# Patient Record
Sex: Male | Born: 1955 | Race: White | Hispanic: No
Health system: Midwestern US, Community
[De-identification: ages and names within clinical notes are randomized; demographics above are authoritative.]

## PROBLEM LIST (undated history)

## (undated) DIAGNOSIS — E7800 Pure hypercholesterolemia, unspecified: Secondary | ICD-10-CM

## (undated) DIAGNOSIS — I10 Essential (primary) hypertension: Secondary | ICD-10-CM

## (undated) HISTORY — PX: HERNIA REPAIR: SHX51

## (undated) HISTORY — PX: ROTATOR CUFF REPAIR: SHX139

---

## 2001-08-06 ENCOUNTER — Encounter: Payer: Self-pay | Admitting: Emergency Medicine

## 2001-08-06 ENCOUNTER — Inpatient Hospital Stay (HOSPITAL_COMMUNITY): Admission: EM | Admit: 2001-08-06 | Discharge: 2001-08-14 | Payer: Self-pay | Admitting: Emergency Medicine

## 2001-08-10 ENCOUNTER — Encounter: Payer: Self-pay | Admitting: Internal Medicine

## 2001-09-11 ENCOUNTER — Emergency Department (HOSPITAL_COMMUNITY): Admission: EM | Admit: 2001-09-11 | Discharge: 2001-09-11 | Payer: Self-pay | Admitting: Emergency Medicine

## 2001-09-11 ENCOUNTER — Encounter: Payer: Self-pay | Admitting: Emergency Medicine

## 2001-09-23 ENCOUNTER — Encounter: Payer: Self-pay | Admitting: Emergency Medicine

## 2001-09-23 ENCOUNTER — Emergency Department (HOSPITAL_COMMUNITY): Admission: EM | Admit: 2001-09-23 | Discharge: 2001-09-23 | Payer: Self-pay | Admitting: Emergency Medicine

## 2001-09-25 ENCOUNTER — Emergency Department (HOSPITAL_COMMUNITY): Admission: EM | Admit: 2001-09-25 | Discharge: 2001-09-25 | Payer: Self-pay | Admitting: Emergency Medicine

## 2001-09-25 ENCOUNTER — Encounter: Payer: Self-pay | Admitting: Emergency Medicine

## 2001-10-04 ENCOUNTER — Encounter: Payer: Self-pay | Admitting: Emergency Medicine

## 2001-10-04 ENCOUNTER — Emergency Department (HOSPITAL_COMMUNITY): Admission: EM | Admit: 2001-10-04 | Discharge: 2001-10-04 | Payer: Self-pay | Admitting: Emergency Medicine

## 2001-12-26 ENCOUNTER — Inpatient Hospital Stay (HOSPITAL_COMMUNITY): Admission: RE | Admit: 2001-12-26 | Discharge: 2001-12-30 | Payer: Self-pay | Admitting: General Surgery

## 2002-01-27 ENCOUNTER — Emergency Department (HOSPITAL_COMMUNITY): Admission: EM | Admit: 2002-01-27 | Discharge: 2002-01-27 | Payer: Self-pay

## 2002-02-07 ENCOUNTER — Emergency Department (HOSPITAL_COMMUNITY): Admission: EM | Admit: 2002-02-07 | Discharge: 2002-02-07 | Payer: Self-pay | Admitting: *Deleted

## 2002-03-02 ENCOUNTER — Emergency Department (HOSPITAL_COMMUNITY): Admission: EM | Admit: 2002-03-02 | Discharge: 2002-03-03 | Payer: Self-pay | Admitting: Emergency Medicine

## 2009-01-01 ENCOUNTER — Emergency Department (HOSPITAL_BASED_OUTPATIENT_CLINIC_OR_DEPARTMENT_OTHER): Admission: EM | Admit: 2009-01-01 | Discharge: 2009-01-01 | Payer: Self-pay | Admitting: Emergency Medicine

## 2009-09-28 ENCOUNTER — Encounter: Admission: RE | Admit: 2009-09-28 | Discharge: 2009-09-28 | Payer: Self-pay | Admitting: Specialist

## 2009-09-29 ENCOUNTER — Encounter: Admission: RE | Admit: 2009-09-29 | Discharge: 2009-09-29 | Payer: Self-pay | Admitting: Specialist

## 2009-09-29 ENCOUNTER — Encounter: Payer: Self-pay | Admitting: Internal Medicine

## 2009-10-28 ENCOUNTER — Encounter: Admission: RE | Admit: 2009-10-28 | Discharge: 2009-10-28 | Payer: Self-pay | Admitting: Specialist

## 2009-11-19 ENCOUNTER — Ambulatory Visit: Payer: Self-pay | Admitting: Internal Medicine

## 2009-11-19 DIAGNOSIS — R059 Cough, unspecified: Secondary | ICD-10-CM | POA: Insufficient documentation

## 2009-11-19 DIAGNOSIS — R05 Cough: Secondary | ICD-10-CM

## 2009-11-19 DIAGNOSIS — R930 Abnormal findings on diagnostic imaging of skull and head, not elsewhere classified: Secondary | ICD-10-CM | POA: Insufficient documentation

## 2009-11-19 DIAGNOSIS — I10 Essential (primary) hypertension: Secondary | ICD-10-CM | POA: Insufficient documentation

## 2010-03-18 ENCOUNTER — Emergency Department (HOSPITAL_BASED_OUTPATIENT_CLINIC_OR_DEPARTMENT_OTHER): Admission: EM | Admit: 2010-03-18 | Discharge: 2010-03-18 | Payer: Self-pay | Admitting: Emergency Medicine

## 2010-10-10 ENCOUNTER — Emergency Department: Payer: Self-pay | Admitting: Emergency Medicine

## 2010-11-21 ENCOUNTER — Encounter: Payer: Self-pay | Admitting: Specialist

## 2010-11-30 NOTE — Assessment & Plan Note (Signed)
Summary: Pulmonary new pt eval for cough/ abn ct   Visit Type:  Initial Consult Copy to:  Dr. Lerry Liner Primary Provider/Referring Provider:  Dr. Lerry Liner  CC:  Cough and Dyspnea.  History of Present Illness: 29 yowm with seasonal rhinitis with no cough or prev asthma/ wheezing/ cough.  November 19, 2009 cc August 2010 abd pain dx diverticulitis requing drains but no major surgery and onset of cough worse lying down productive initermittently of yellow sputum. takes breath away never gags vomits, breathing labored sometimes even  if not coughing.    Pt denies any significant sore throat, dysphagia, itching, sneezing,  nasal congestion or excess secretions,  fever, chills, sweats, unintended wt loss, pleuritic or exertional cp, hempoptysis, change in activity tolerance  orthopnea pnd or leg swelling Pt also denies any obvious fluctuation in symptoms with weather or environmental change or other alleviating or aggravating factors.       Current Medications (verified): 1)  Azor 10-20 Mg Tabs (Amlodipine-Olmesartan) .Marland Kitchen.. 1 Once Daily 2)  Klonopin 0.5 Mg Tabs (Clonazepam) .Marland Kitchen.. 1 At Bedtime 3)  Percocet 5-325 Mg Tabs (Oxycodone-Acetaminophen) .Marland Kitchen.. 1 Every 6-8 Hours As Needed  Allergies (verified): No Known Drug Allergies  Past History:  Past Medical History: Hypertension Chronic cough .......................................Marland KitchenWert    - onset 05/2009    - CT chest 09/29/09  1. The opacity questioned by chest x-ray in the region of the lingula probably represents linear atelectasis or scarring. 2. However there is a "tree in bud" appearance within the left lower lobe and to a lesser degree within the right lower lobe suggestive of an active inflammatory process as noted above.  Family History: Negative for respiratory diseases or atopy   Social History: Divorced Lives with Girlfriend Production designer, theatre/television/film Never smoker No ETOH  Review of Systems       The patient complains of  shortness of breath with activity, shortness of breath at rest, and productive cough.  The patient denies non-productive cough, coughing up blood, chest pain, irregular heartbeats, acid heartburn, indigestion, loss of appetite, weight change, abdominal pain, difficulty swallowing, sore throat, tooth/dental problems, headaches, nasal congestion/difficulty breathing through nose, sneezing, itching, ear ache, anxiety, depression, hand/feet swelling, joint stiffness or pain, rash, change in color of mucus, and fever.    Vital Signs:  Patient profile:   55 year old male Height:      73 inches Weight:      243.13 pounds BMI:     32.19 O2 Sat:      95 % on Room air Temp:     98.4 degrees F oral Pulse rate:   56 / minute BP sitting:   136 / 90  (left arm)  Vitals Entered By: Vernie Murders (November 19, 2009 10:03 AM)  O2 Flow:  Room air  Physical Exam  Additional Exam:  pleasant amb wm nad with intermittent violent upper airway pattern cough wt 243 November 19, 2009 HEENT: nl dentition, turbinates, and orophanx. Nl external ear canals without cough reflex NECK :  without JVD/Nodes/TM/ nl carotid upstrokes bilaterally LUNGS: no acc muscle use,   cough reproducible  on  exp maneuvers CV:  RRR  no s3 or murmur or increase in P2, no edema  ABD:  soft and nontender with nl excursion in the supine position. No bruits or organomegaly, bowel sounds nl MS:  warm without deformities, calf tenderness, cyanosis or clubbing SKIN: warm and dry without lesions   NEURO:  alert, approp, no deficits  CT of Chest  Procedure date:  09/29/2009  Findings:      1. The opacity questioned by chest x-ray in the region of the lingula probably represents linear atelectasis or scarring. 2. However there is a 'tree in bud' appearance within the left lower lobe and to a lesser degree within the right lower lobe suggestive of an active inflammatory process as noted above.    Impression &  Recommendations:  Problem # 1:  COUGH (ICD-786.2)  The most common causes of chronic cough in immunocompetent adults include: upper airway cough syndrome (UACS), previously referred to as postnasal drip syndrome,  caused by variety of rhinosinus conditions; (2) asthma; (3) GERD; (4) chronic bronchitis from cigarette smoking or other inhaled environmental irritants; (5) nonasthmatic eosinophilic bronchitis; and (6) bronchiectasis. These conditions, singly or in combination, have accounted for up to 94% of the causes of chronic cough in prospective studies.   Believe this fits best with upper airway cough syndrome so named because it's frequently impossible to sort out how much is LPR vs CR/sinusitis with freq throat clearing generating secondary extra esophageal GERD from wide swings in gastric pressure that occur with throat clearing, promoting self use of mint and menthol lozenges that reduce the lower esophageal sphincter tone and exacerbate the problem further.  These symptoms are easily confused with asthma/copd by even experienced pulmonogists because they overlap so much. These are the same pts who not infrequently have failed to tolerate ace inhibitors,  dry powder inhalers or biphosphonates or report having reflux symptoms that don't respond to standard doses of PPI   for now max rx of GERD with next step sinus ct  Orders: Consultation Level V (16109)  Problem # 2:  ABNORMAL LUNG XRAY (ICD-793.1)  The density in question on cxr is nothing more than linear atx likely related to all the subdiagphragmatic problems he had dating back to Aug 2010.  The "tree in bud" finding is subtle and totally nonspecific and would not pursue this abn with serial ct's but simply follow his symptoms closely and pursue the standard w/u for chronic cough as outlined.  I did advise pt that the standardized cough guidelines recently published in Chest are a 14 step process, not a single office visit,  and are  intended  to address this problem logically,  with an alogrithm dependent on response to each progressive step  to determine a specific diagnosis with  minimal addtional testing needed. Therefore if compliance is an issue this empiric standardized approach simply won't work.   Orders: Consultation Level V [phone])  Medications Added to Medication List This Visit: 1)  Azor 10-20 Mg Tabs (Amlodipine-olmesartan) .Marland Kitchen.. 1 once daily 2)  Klonopin 0.5 Mg Tabs (Clonazepam) .Marland Kitchen.. 1 at bedtime 3)  Percocet 5-325 Mg Tabs (Oxycodone-acetaminophen) .Marland Kitchen.. 1 every 6-8 hours as needed 4)  Nexium 40 Mg Cpdr (Esomeprazole magnesium) .... By mouth daily. take one half hour before eating. 5)  Prednisone 10 Mg Tabs (Prednisone) .... 4 each am x 2days, 2x2days, 1x2days and stop 6)  Tramadol Hcl 50 Mg Tabs (Tramadol hcl) .... One to two by mouth every 4-6 hours as needed for cough 7)  Pepcid Ac Maximum Strength 20 Mg Tabs (Famotidine) .... One at  bedtime  Patient Instructions: 1)  Acid reflux is the leading suspect here and needs to be eliminated  completely before considering additional studies or treatment options. To suppress this maximally, take nexium 30 min before meal of the day  and pepcid 20 mg (  otc) at bedtime plus diet measures as listed 2)  GERD (REFLUX)  is a common cause of respiratory symptoms. It commonly presents without heartburn and can be treated with medication, but also with lifestyle changes including avoidance of late meals, excessive alcohol, smoking cessation, and avoid fatty foods, chocolate, peppermint, colas, red wine, and acidic juices such as orange juice. NO MINT OR MENTHOL PRODUCTS SO NO COUGH DROPS  3)  USE SUGARLESS CANDY INSTEAD (jolley ranchers)  4)  NO OIL BASED VITAMINS  5)  Take mucinex dm  every 12 hours and add tramadol 50 mg up to every 4 hours to suppress the urge to cough. Swallowing water or using ice chips/non mint and menthol containing candies (such as lifesavers or  sugarless jolly ranchers) are also effective.  6)  Prednsione 4 each am x 2days, 2x2days, 1x2days and stop  7)  Please schedule a follow-up appointment in 2 weeks. Prescriptions: TRAMADOL HCL 50 MG  TABS (TRAMADOL HCL) One to two by mouth every 4-6 hours as needed for cough  #40 x 0   Entered and Authorized by:   Nyoka Cowden MD   Signed by:   Nyoka Cowden MD on 11/19/2009   Method used:   Print then Give to Patient   RxID:   [id number] PREDNISONE 10 MG  TABS (PREDNISONE) 4 each am x 2days, 2x2days, 1x2days and stop  #14 x 0   Entered and Authorized by:   Nyoka Cowden MD   Signed by:   Nyoka Cowden MD on 11/19/2009   Method used:   Print then Give to Patient   RxID:   [id number] NEXIUM 40 MG  CPDR (ESOMEPRAZOLE MAGNESIUM) By mouth daily. Take one half hour before eating.  #34 x 2   Entered and Authorized by:   Nyoka Cowden MD   Signed by:   Nyoka Cowden MD on 11/19/2009   Method used:   Print then Give to Patient   RxID:   [id number]

## 2011-03-18 NOTE — Op Note (Signed)
[place]. [facility]  Patient:    Cole Carey, Cole Carey Visit Number: [id number] MRN: [id number]          Service Type: MED Location: MICU 2112 01 Attending Physician:  Madaline Guthrie Dictated by:   Adolph Pollack, M.D. Proc. Date: 08/06/01 Admit Date:  08/05/2001   CC:         Anselmo Rod, M.D.   Operative Report  PREOPERATIVE DIAGNOSIS:  Bleeding duodenal ulcer.  POSTOPERATIVE DIAGNOSIS:  Bleeding duodenal ulcer.  OPERATION PERFORMED:  Exploratory laparotomy, oversewing of bleeding duodenal ulcer, pyloroplasty.  SURGEON:  Adolph Pollack, M.D.  ASSISTANT:  Currie Paris, M.D.  ANESTHESIA:  General.  INDICATIONS FOR PROCEDURE:  The patient is a 55 year old male who came in with lower gastrointestinal bleeding and was found to have a bleeding duodenal ulcer by endoscopy.  Epinephrine injections were attempted to control the bleeding.  However, he continued to have bleeding with hypotension and endoscopy.  He is now brought to the operating room.  OPERATIVE FINDINGS:  In the first portion of the duodenum on the anterior wall there is an ulceration with a visible vessel that was oozing blood.  DESCRIPTION OF PROCEDURE:  He was placed supine on the operating table and general anesthetic was administered.  The abdomen was sterilely prepped and draped.  An upper midline incision was made incising the skin sharply and dividing the subcutaneous tissue and peritoneum with the cautery.  The abdominal cavity was then entered.  A Bookwalter retractor was used for exposure.  He was then placed in the reversed Trendelenburg position.  A Kocher maneuver was performed bringing up the duodenum into the wound.  I then placed stay sutures at the superior and inferior aspects of the pylorus. I made an incision through the pylorus approximately 1 to 2 cm up on the stomach and 1 to 2 cm down onto the duodenum.  Upon making this incision I looked at  the superior and anterior wall and noticed oozing from ulceration with a visible vessel.  I oversewed this with a single 2-0 silk horizontal mattress type suture and this quickly halted the bleeding.  I examined the rest of the area and noted the ampulla and there was some mild drainage from it.  I then noticed a small posterior type ulceration on which I put a single suture in.  I repositioned the NG tube to be in the body of the stomach.  I then performed pyloroplasty in a Heineke-Mikulicz type fashion.  First three sutures were Gambee type sutures and the next sutures were simple interrupted using a 3-0 and 2-0 silk.  I then placed a patch of omentum over the closure.  The abdominal cavity was then irrigated copiously with normal saline and fluid evacuated.  The fascia was then closed with running #1 PDS suture.  The subcutaneous tissues were irrigated and the skin closed with staples.  Before closure, All sponge, needle and instrument counts were reported to be correct.  The patient tolerated the procedure well without any apparent complications and remained hemodynamically stable in the operating room.  He will be taken to the recovery room and the intensive care unit. Dictated by:   Adolph Pollack, M.D. Attending Physician:  Madaline Guthrie DD:  08/06/01 TD:  08/07/01 Job: [id number]

## 2011-03-18 NOTE — Discharge Summary (Signed)
[name]. [facility]  Patient:    Cole Carey, Cole Carey Visit Number: [id number] MRN: [id number]          Service Type: MED Location: [id number] Attending Physician:  Madaline Guthrie Dictated by:   Arlis Porta, M.D. Admit Date:  08/05/2001 Discharge Date: 08/14/2001   CC:         Adolph Pollack, M.D.  Anselmo Rod, M.D.   Discharge Summary  DISCHARGE DIAGNOSES:  Melena secondary to duodenal ulcer.  CONSULTS: 1. Anselmo Rod, M.D., gastroenterology. 2. Adolph Pollack, M.D., surgery.  PROCEDURES: 1. The patient had a chest x-ray done on August 10, 2001, which showed right    jugular central line to the superior vena cava without pneumothorax. 2. The patient had an esophagogastroduodenoscopy with injection of a duodenal    vessel done by Dr. Loreta Ave on August 06, 2001. 3. The patient then went to the operating room for an exploratory laparotomy    with oversewing of bleeding duodenal ulcer and pyloroplasty by    Dr. Abbey Chatters on August 06, 2001. 4. The patient received four units of packed red blood cells transfusion    during his admission. 5. The patient had a Helicobacter pylori antibody test done on August 06, 2001, which was low at 0.14. 6. The patients final CBC on August 13, 2001, showed a white blood cell    count of 6.4, hemoglobin of 10.5, hematocrit of 30.3 and a platelet    count of 291. 7. The patients final BMP done on August 12, 2001, showed a sodium of 139,    potassium of 4.2, chloride of 107, CO2 of 26, BUN of 16, creatinine of    1.0, glucose of 97 and a calcium level of 8.7. 8. The patient had an EKG done on August 06, 2001, which showed normal sinus    rhythm with a heart rate of 77.  HOSPITAL COURSE:  Upper gastrointestinal bleeding secondary to duodenal bleeding ulcer, likely secondary to aspirin use.  This is a 55 year old white male who presented with one days worth of dark, watery diarrhea with a total of  three episodes on the day of admission.  His initial hemoglobin on admission was 13.8 and hematocrit was 40.3.  During his early admission, the patient continued to have bleeding episodes that consisted of dark stools for a total of seven episodes.  The patient had a repeat CBC done the following day, which showed a level of hemoglobin of 10.6 and hematocrit of 30.9.  The patient also started to experience orthostasis symptoms with dizziness and lightheadedness in an upright position.  Given the patients acute deterioration and continued bleeding, the patient was quickly seen by Dr. Loreta Ave from gastroenterology.  He went for emergent EGD.  Dr. Loreta Ave thus saw a bleeding ulcer in the duodenal bulb and injected 9 cc of epinephrine.  The patients blood pressure dropped with a reflex tachycardia at the end of his procedure.  The patient then voluntarily pulled out the endoscope before good coagulation was documented.  Given the patients drop in blood pressure and tachycardia, he was sent to the OR for emergent exploratory laparotomy, which was performed by Dr. Abbey Chatters.  During the procedure, the patient had a sewing of the duodenal bulb and pyloroplasty.  Following the procedure, the patient had serial CBCs checked daily, which showed a stable level of his hematocrit around the 10.0 range.  The patients vital signs seemed to stabilize following his  procedure and had no further bleeding.  His only complication was a slow to resolve ileus that took several days to resolve. Finally, the patient started to pass gas and return of his bowel sounds.  He was started on a clear-liquid diet, which he tolerated and then was advanced to a full regular diet which he tolerated the day of discharge.  At the time of discharge, the patient remained afebrile with continued stable vital signs and was ambulating and was controlled for his pain by oral pain medications. It was decided that it was safe to discharge  the patient to home with followup with Dr. Abbey Chatters in the office.  DISCHARGE MEDICATIONS: 1. Vicodin 5/500 mg take 1-2 tablets p.o. q.4-6h. p.r.n. for pain. 2. Protonix 40 mg p.o. q.d. 3. The patient was told to stop taking aspirin, Advil, Aleve, etc. for pain.    These can cause stomach ulcers.  The patient was advised to take only    Tylenol in the future for pain.  ACTIVITY:  The patient is to avoid heavy lifting over 20 pounds for three weeks.  DIET RESTRICTIONS:  None.  WOUND CARE:  The patient is to watch his incision for signs of infection including fever, increased pain, pus, drainage, redness, warmth and induration of the skin.  The patient was told that he could take showers.  SPECIAL INSTRUCTIONS:  He is to call the doctor or come back to the hospital for signs of infection as above.  Return if blood in his stools or vomiting, chest pain, belly pain, or shortness of breath.  FOLLOWUP:  He has been made an appointment to see Dr. Abbey Chatters, his surgeon on October 25 at 12:10 p.m.  He is to call [phone] if he needs to reschedule. He was also told to call Dr. Trilby Drummer office at [phone] to discuss the need for a colonoscopy.  He was also referred to the [facility] to see Dr. Emmit Alexanders at [phone] if needed in the future for other medical problems. Dictated by:   Arlis Porta, M.D. Attending Physician:  Madaline Guthrie DD:  08/14/01 TD:  08/14/01 Job: [id number]

## 2011-03-18 NOTE — H&P (Signed)
[facility]  Patient:    Cole Carey, Cole Carey Visit Number: [id number] MRN: [id number]          Service Type: MED Location: 3W [phone] Attending Physician:  Arlis Porta Dictated by:   Adolph Pollack, M.D. Admit Date:  12/25/2001                           History and Physical  REASON FOR ADMISSION:  Elective ventral hernia repair.  HISTORY OF PRESENT ILLNESS:  The patient is a 55 year old male who had an emergency exploratory laparotomy and oversewing of bleeding duodenal ulcer back in the fall of 2002.  He was doing well.  He is in [place] and was doing some heavy work and felt a tear in the incision and noticed a bulge.  I examined him in the office and indeed noted that he had a ventral incision hernia of the epigastric wound.  He is now admitted electively for repair.  PAST MEDICAL HISTORY:  Bleeding duodenal ulcer.  PREVIOUS OPERATIONS:  Exploratory laparotomy and oversewing of bleeding duodenaL ulcer; repair of dislocated shoulder; bilateral inguinal hernia repair; umbilical hernia repair.  ALLERGIES:  PENICILLIN.  MEDICATIONS:  Prevacid.  SOCIAL HISTORY:  No alcohol or tobacco use.  He works for [employer].  PHYSICAL EXAMINATION:  GENERAL:  A muscular male in no acute distress.  Pleasant and cooperative.  HEENT:  Extraocular movements intact.  No icterus.  CARDIOVASCULAR:  Heart demonstrates regular rate and rhythm without a murmur. No lower extremity edema.  RESPIRATORY:  Breath sounds are equal and clear.  ABDOMEN:  Soft with upper midline incision and palpable abdominal defect measuring approximately 8 cm.  It is reducible.  Active bowel sounds noted.  EXTREMITIES:  Muscular and full range of motion.  IMPRESSION:  Ventral incisional hernia.  PLAN:  Laparoscopic repair of ventral incisional hernia with mesh.  I did explain the procedure and the risks to him including but not limited to bleeding,  infection, damage to intra-abdominal organs and hernia recurrence. We also talked about strict work restrictions postoperative.  He understands and agrees to proceed. Dictated by:   Adolph Pollack, M.D. Attending Physician:  Arlis Porta DD:  12/28/01 TD:  12/28/01 Job: [id number]

## 2011-03-18 NOTE — Consult Note (Signed)
[place]. [facility]  Patient:    Cole Carey, Cole Carey Visit Number: [id number] MRN: [id number]          Service Type: MED Location: MICU 2112 01 Attending Physician:  Madaline Guthrie Dictated by:   Clance Boll, M.D. Proc. Date: 08/06/01 Admit Date:  08/05/2001   CC:         Adolph Pollack, M.D.   Consultation Report  DATE OF BIRTH:  Dec 21, 2055  PRIMARY MEDICAL DOCTOR:  Gentry Fitz.  REQUESTING CONSULT:  Medicine teaching service.  REASON FOR CONSULTATION:  Melanic stools.  HISTORY OF PRESENT ILLNESS:  This is a very pleasant, 55 year old, white male admitted to the medical teaching service earlier this morning with a complaint of new onset dark black stools every 15 minutes x 3 starting at approximately 7 p.m. last night.  No abdominal pain, nausea, vomiting, fevers, or chills. No history of GI bleed.  Since admission, he notes three more melanic stools with some bright red blood per rectum ("moorishness") and now with dizziness on standing and severe headaches with bowel movements.  The patient admits to daily ASA use (two p.o. q.h.s.), but no NSAIDs.  He is an avid weight lifter and uses large amounts of supplements and Depo-Testosterone.  No recent increased stressors.  No weight loss.  No rash.  No joint pains.  No hematuria.  PAST MEDICAL HISTORY:  Umbilical hernia repair four years ago.  Bilateral inguinal hernia repair as a child.  Right shoulder dislocation.  MEDICATIONS:  1. ASA two p.o. q.h.s.  2. Creatinine.  3. Depo-Testosterone 300 mg IM every week.  4. AD40.  5. Glucose recovery drinks.  6. Mega vitamin every week.  7. K-Lyte on occasion.  8. Whey protein 500 g per day.  9. Androstene dion q.d. 10. History of Dianabol use.  The last time was five to six weeks ago.  ALLERGIES:  PENICILLIN.  FAMILY HISTORY:  Hid dad died of lung cancer at 9, but also had peptic ulcer disease and alcohol and tobacco abuse.  His mom is healthy  at 56.  A sister has lupus and stroke.  No GI cancers.  No bleeding disorders.  SOCIAL HISTORY:  He reports a history of tobacco use, one pack per day x 14 years and quit in 1987.  No ETOH or illicit drug use.  He is married and has two children from his prior marriage.  He is a Administrator and works out q.d., except Sundays.  PHYSICAL EXAMINATION:  He is afebrile with pulse of 104 and a respiratory rate of 16.  The blood pressure laying down is 178/90 with a pulse of 90 and standing is 144/102 with a pulse of 130.  GENERAL APPEARANCE:  He is alert and interactive, but short of breath with exertion.  HEENT:  Sclerae anicteric.  Mucous membranes moist without lesions.  NECK:  Supple.  No LAD.  No JVD.  CARDIOVASCULAR:  Tachycardic S1 and S2.  No murmurs, rubs, or gallops.  LUNGS:  CTAP.  ABDOMEN:  Soft, nontender, and nondistended.  Normoactive bowel sounds.  No HSM.  No masses.  EXTREMITIES:  2+ DP and PT pulses bilaterally.  No edema.  RECTAL:  The patient declined rectal because he had to have a bowel movement, which was approximately 4 ounces in size.  A black, thick stool with a small amount of bright red blood.  LABORATORY DATA:  The hemoglobin on admission was 16 with a hematocrit of 47 and white blood cell count 9.8.  Following admission, the hemoglobin subsequently dropped from 16 to 13.8 to 12.4 to 10.3.  UA normal.  UDS negative.  Sodium 137, potassium 4.9, chloride 109, bicarbonate 23, BUN 28, creatinine 1.1, glucose 112, calcium 8.3, total bilirubin 1.1, alkaline phosphatase 48, SGOT 56, SGPT 84, total protein 6.4, albumin 3.0, calcium 8.1. PTT 22, PT 12.6, INR 1.1.  The EKG showed normal sinus rhythm with a right axis deviation and no acute ST-T wave changes.  ASSESSMENT AND PLAN:  This is a 55 year old white male with new melanic stools likely secondary to an upper GI bleed.  In light of his positive orthostatics and his continued bright red blood per rectum,  the plan is for emergent EGD this afternoon to control bleeding.  Will check stat CBC now, continue IV fluids, and continue Protonix IV.  May also need to do colonoscopy, especially if no source of bleeding is found by EGD.  The risks and benefits are discussed with the patient and he agrees to proceed.  Consent is signed and on the chart. Dictated by:   Clance Boll, M.D. Attending Physician:  Madaline Guthrie DD:  08/07/01 TD:  08/07/01 Job: [id number]

## 2011-03-18 NOTE — Consult Note (Signed)
[place]. [facility]  Patient:    Cole Carey, Cole Carey Visit Number: [id number] MRN: [id number]          Service Type: MED Location: MICU 2112 01 Attending Physician:  Madaline Guthrie Dictated by:   Adolph Pollack, M.D. Admit Date:  08/05/2001   CC:         Anselmo Rod, M.D.  Madaline Guthrie, M.D.   Consultation Report  REFERRING PHYSICIAN:  Anselmo Rod, M.D.  REASON FOR CONSULTATION:  Duodenal ulcer.  HISTORY OF PRESENT ILLNESS:  This is a 55 year old male who presented to the emergency department after noticing watery, melanic-type stools. He continued to pass these dark stools, and it became somewhat bright red. He said the volume became larger. He initially had a normal hemoglobin, but this drifted down as he continued to bleed. He was subsequently taken to an endoscopy where he underwent upper endoscopy which showed a bleeding duodenal ulcer (first portion). Epinephrine was injected around the site to try to stop the bleeding, but it continued to bleed. He became hypotensive in endoscopy and had blood started, and I was called to see him.  PAST MEDICAL HISTORY:  Denies chronic illnesses.  PAST SURGICAL HISTORY: 1. Repair of dislocated shoulder. 2. Umbilical hernia repair. 3. Bilateral inguinal hernia repair.  ALLERGIES:  PENICILLIN.  CURRENT MEDICATIONS: 1. Aspirin twice a day. 2. Testosterone. 3. Androgenic steroids.  SOCIAL HISTORY:  He denies use of alcohol or tobacco. He has a history in the remote past of IV drug abuse.  REVIEW OF SYSTEMS:  CARDIOVASCULAR:  He denies any known heart disease or hypertension. PULMONARY:  He denies any asthma, tuberculosis, or pneumonia. GI:  He denies any indigestion, any sharp burning, gastric pain. Denies history of peptic ulcer disease. HEMATOLOGIC:  He denies bleeding disorder. He does state that he has difficult IV access problems.  PHYSICAL EXAMINATION:  GENERAL:  Muscular male in no  acute distress. He is pale appearing.  VITAL SIGNS:  Pulse 130.  LUNGS:  Equal breath sounds. Clear to auscultation.  CARDIAC:  Increased rate.  ABDOMEN:  Soft, nontender, nondistended. There is a transverse ______ umbilical scar and two bilateral inguinal scars. No masses noted.  IMPRESSION:  A bleeding duodenal ulcer with failed endoscopic management.  RECOMMENDATION:  To the OR for exploratory laparotomy ______  of bleeding ulcer and pyloroplasty. I have explained the procedure, rationale, and risks to the patient, his wife, and his sister. The risks include, but are not limited to, bleeding, infection, risk of anesthesia, and the risk of rebleeding from the ulceration. They all seem to understand this and agree to proceed. Dictated by:   Adolph Pollack, M.D. Attending Physician:  Madaline Guthrie DD:  08/06/01 TD:  08/07/01 Job: [id number]

## 2011-03-18 NOTE — Consult Note (Signed)
[place]. [facility]  Patient:    BOSCO, PAPARELLA Visit Number: [id number] MRN: [id number]          Service Type: EMS Location: MINO Attending Physician:  Devoria Albe Dictated by:   Patricia Nettle, M.D. Proc. Date: 03/02/02 Admit Date:  03/02/2002 Discharge Date: 03/03/2002                            Consultation Report  CHIEF COMPLAINT:  Left arm pain.  HISTORY OF PRESENT ILLNESS:  The patient is a 55 year old male who was lifting a refrigerator tonight, felt a pop in his left arm, noticed immediate deformity and pain. He comes in. He has no numbness or tingling. No history of injuries to the left upper extremity.  PAST MEDICAL AND SURGICAL HISTORY:  Noncontributory.  PHYSICAL EXAMINATION:  LEFT UPPER EXTREMITY:  Shows obvious deformity about the biceps tendon. There is tenderness at the bicipital insertion distally. Full range of motion. Distal neurovascular exam is intact.  IMPRESSION:  Left distal biceps tendon rupture.  PLAN:  I reviewed the situation with the patient and the emergency room physician. I explained to him that he is going to need this surgically repaired. I will have him followup with Dr. Roderic Ovens who does this type of surgery. He was given the phone number and will make an appointment on Monday morning. Dictated by:   Patricia Nettle, M.D. Attending Physician:  Devoria Albe DD:  03/03/02 TD:  03/04/02 Job: [id number]

## 2011-03-18 NOTE — Procedures (Signed)
[place]. [facility]  Patient:    Cole Carey, Cole Carey Visit Number: [id number] MRN: [id number]          Service Type: Attending:  Anselmo Rod, M.D. Dictated by:   Anselmo Rod, M.D. Proc. Date: 08/06/01   CC:         Madaline Guthrie, M.D.  Adolph Pollack, M.D.   Procedure Report  DATE OF BIRTH:  July 11, 1956.  PROCEDURE:  Esophagogastroduodenoscopy with injection of a vessel in the duodenal bulb.  INDICATION FOR PROCEDURE:  A 55 year old white male with a history of aspirin and steroid use, who developed bloody stools for over 12 hours.  Patients hemoglobin has dropped from 16 to 10.3 g/dl since admission yesterday.  PREPROCEDURE PREPARATION:  Informed consent was procured from the patient. The patient was fasted for eight hours prior to the procedure and received IV fluids, serial CBCs were done.  Consent was procured from the patient.  PREPROCEDURE PHYSICAL:  VITAL SIGNS:  The patient had 98% saturation on room air.  Pulse was tachycardic at 130, blood pressure was 175/110.  NECK:  Supple.  CHEST:  Clear to auscultation.  S1, S2 regular.  ABDOMEN:  Soft with epigastric tenderness on palpation with guarding.  No rebound or rigidity.  DESCRIPTION OF PROCEDURE:  The patient was placed in the left lateral decubitus position and sedated with 100 mg of Demerol and 10 mg of Versed intravenously.  Once the patient was adequately sedate and maintained on low-flow oxygen and continuous cardiac monitoring, the Olympus video panendoscope was advanced through the mouthpiece, over the tongue, into the esophagus under direct vision.  The entire esophagus appeared normal without evidence of ring, stricture, masses, lesions, esophagitis, or Barretts mucosa.  The scope was then advanced in the stomach.  There was a large amount of old blood in the stomach.  A small antral ulcer may be present, but this was not adequately visualized because of a  large amount of blood and debris in the stomach.  There was fresh blood seen refluxing back from the duodenal bulb into the antrum.  The scope was gently passed into the bulb and the proximal small bowel.  On gradually withdrawing the scope in the duodenal bulb, a visible pulsatile vessel was seen with active bleeding.  The patients blood pressure dropped to 75/51 during the procedure.  Epinephrine 9 cc was injected around the visible vessel; however, in spite of the injection, there was a large amount of bleeding, and the duodenal bulb filled up with blood.  The procedure was aborted at this point with plans for surgical intervention.  IMPRESSION:  Visible vessel with active bleeding in the duodenal bulb. Epinephrine 9 cc injected.  RECOMMENDATIONS: 1. Surgical evaluation. 2. Serial CBCs. 3. Transfuse two units of blood now. 4. Further recommendations as per the surgery service. Dictated by:   Anselmo Rod, M.D. Attending:  Anselmo Rod, M.D. DD:  08/06/01 TD:  08/07/01 Job: [id number]

## 2011-03-18 NOTE — Discharge Summary (Signed)
[facility]  Patient:    Cole Carey, Cole Carey Visit Number: [id number] MRN: [id number]          Service Type: MED Location: 3W [phone] Attending Physician:  Arlis Porta Dictated by:   Adolph Pollack, M.D. Admit Date:  12/25/2001 Discharge Date: 12/30/2001                             Discharge Summary  PRINCIPLE DISCHARGE DIAGNOSIS:  Ventral incisional hernia.  SECONDARY DISCHARGE DIAGNOSIS:  Duodenal ulcer.  PROCEDURE:  Laparoscopic repair of ventral incisional hernia with mesh.  REASON FOR ADMISSION:  This 55 year old male underwent emergency operation for bleeding duodenal ulcer October of 2002. He does heavy work and prior to admission felt a tear and a hernia was documented. He now presents for elective repair of his hernia. It is in the upper epigastric region.  HOSPITAL COURSE:  He underwent the above operation. Postoperatively he had a fair amount of pain controlled with PCA pump. His pain slowly improved. His oral intake increased and he switched to oral pain medications. On December 30, 2001, his pain was much better, he was tolerating a diet, tolerating oral pain medications, ambulatory and ready for discharge.  DISPOSITION:  Discharged to home on December 30, 2001 in satisfactory condition. He was given strict activity restrictions and he is to lift nothing heavier than 5 pounds until released by me. He will come back and see me in the office in two to three weeks. He was given Tylox for pain and told to continue his Prevacid. An instruction sheet was sent home with him. Dictated by:   Adolph Pollack, M.D. Attending Physician:  Arlis Porta DD:  01/07/02 TD:  01/09/02 Job: [id number]

## 2011-03-18 NOTE — Op Note (Signed)
[facility]  Patient:    Cole Carey, Cole Carey Visit Number: [id number] MRN: [id number]          Service Type: MED Location: 3W [phone] Attending Physician:  Arlis Porta Dictated by:   Adolph Pollack, M.D. Proc. Date: 12/25/01 Admit Date:  12/25/2001                             Operative Report  PREOPERATIVE DIAGNOSIS:  Ventral incisional hernia.  POSTOPERATIVE DIAGNOSIS:  Ventral incisional hernia.  PROCEDURE:  Laparoscopic repair of ventral incisional hernia with Gortex Dual Mesh.  SURGEON:  Adolph Pollack, M.D.  ASSISTANT:  Thornton Park. Daphine Deutscher, M.D.  ANESTHESIA:  General.  INDICATIONS:  Mr. Florentina Addison is a 55 year old male who underwent emergency exploratory laparotomy for a bleeding duodenal ulcer back in the fall of 2002. 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 He now presents for elective repair of his hernia.  TECHNIQUE:  He was placed supine on the operating table and a general anesthetic was administered. The abdomen was sterilely prepped and draped. In the left mid abdomen, the anterior axillary line, an incision was made incising the skin and subcutaneous tissue sharply. The fascial layers were identified and incised. Muscle splitting was used on the muscle. The peritoneal cavity was then entered bluntly and under direct vision. Anchoring sutures were placed on either side of the fascia in a pursestring type fashion using 0 Vicryl. A Hasson trocar was introduced into the peritoneal cavity and a pneumoperitoneum created by instillation of CO2 gas. Next, the laparoscope was introduced and I could see omental adhesions up to the anterior abdominal wall and visualized the hernia. An 11 mm trocar was placed in the right mid abdomen anterior axillary line and a 5 mm trocar placed in the left lower quadrant. Omental adhesions were taken down sharply with the  Harmonic scalpel. I then took down the falciform ligament with the Harmonic scalpel. This completely exposed the hernia area. I placed four spinal needles at the periphery of the hernia in four quadrants then measured 3 cm away from each of these and drew an oval connecting the dot areas. Superiorly, this went underneath the sternum. I then brought in a piece of polypropylene mesh. The defect had measured approximately 9 x 12 cm. The appropriate piece of mesh was then cut out to match the oval on the skin. In the four quadrants, anchoring sutures of 0 Novafil were placed. In the superior quadrant, this was placed closer to the interior of the mesh so I could place the tacks superior to this well up under the sternum.  Next, the mesh was rolled up and placed into the abdominal cavity then unfurled with the rough side up and the smooth side down. Of note, was that i decided right before putting the mesh in, to put a total of eight anchoring sutures in. I then made eight stab incisions to correspond with the anchoring sutures and brought each of the anchoring sutures up around the fascial bridge. This was done with the Endoclose device. I then lifted up the anchoring sutures and approximated the mesh to the anterior abdominal wall easily covering the defect. I tied these down. I then used the spiral tag to further anchor the mesh in the periphery. We had at least 3 to 4 cm of overlap between the mesh and  hernia in all directions.  Next, I inspected the area and noted no bleeding. All trocars were removed. The fascia defect in the left mid abdomen was closed by tightening up and tying down the pursestring suture. All incisions were closed with 4-0 Monocryl subcuticular stitches followed by Steri-Strips and sterile dressings.  He tolerated the procedure well without any apparent complications and was taken to the recovery room in satisfactory condition. He will be monitored overnight.  Discharge will be when he is comfortable from a pain standpoint with oral analgesics. Dictated by:   Adolph Pollack, M.D. Attending Physician:  Arlis Porta DD:  12/28/01 TD:  12/29/01 Job: [id number]

## 2014-04-12 ENCOUNTER — Encounter (HOSPITAL_COMMUNITY): Payer: Self-pay | Admitting: Emergency Medicine

## 2014-04-12 ENCOUNTER — Emergency Department (HOSPITAL_COMMUNITY)
Admission: EM | Admit: 2014-04-12 | Discharge: 2014-04-12 | Disposition: A | Discharge status: Home / Self Care | Payer: Self-pay | Attending: Emergency Medicine | Admitting: Emergency Medicine

## 2014-04-12 ENCOUNTER — Emergency Department (HOSPITAL_COMMUNITY): Payer: Self-pay

## 2014-04-12 DIAGNOSIS — M25511 Pain in right shoulder: Secondary | ICD-10-CM

## 2014-04-12 DIAGNOSIS — S46909A Unspecified injury of unspecified muscle, fascia and tendon at shoulder and upper arm level, unspecified arm, initial encounter: Secondary | ICD-10-CM | POA: Insufficient documentation

## 2014-04-12 DIAGNOSIS — E7800 Pure hypercholesterolemia, unspecified: Secondary | ICD-10-CM | POA: Insufficient documentation

## 2014-04-12 DIAGNOSIS — Y939 Activity, unspecified: Secondary | ICD-10-CM | POA: Insufficient documentation

## 2014-04-12 DIAGNOSIS — Y929 Unspecified place or not applicable: Secondary | ICD-10-CM | POA: Insufficient documentation

## 2014-04-12 DIAGNOSIS — S4980XA Other specified injuries of shoulder and upper arm, unspecified arm, initial encounter: Secondary | ICD-10-CM | POA: Insufficient documentation

## 2014-04-12 DIAGNOSIS — W19XXXA Unspecified fall, initial encounter: Secondary | ICD-10-CM

## 2014-04-12 DIAGNOSIS — W108XXA Fall (on) (from) other stairs and steps, initial encounter: Secondary | ICD-10-CM | POA: Insufficient documentation

## 2014-04-12 DIAGNOSIS — Z79899 Other long term (current) drug therapy: Secondary | ICD-10-CM | POA: Insufficient documentation

## 2014-04-12 DIAGNOSIS — I10 Essential (primary) hypertension: Secondary | ICD-10-CM | POA: Insufficient documentation

## 2014-04-12 HISTORY — DX: Essential (primary) hypertension: I10

## 2014-04-12 HISTORY — DX: Pure hypercholesterolemia, unspecified: E78.00

## 2014-04-12 MED ORDER — HYDROMORPHONE HCL PF 1 MG/ML IJ SOLN
1.0000 mg | Freq: Once | INTRAMUSCULAR | Status: AC
  Administered 2014-04-12: 1 mg via INTRAMUSCULAR
  Filled 2014-04-12: qty 1

## 2014-04-12 MED ORDER — OXYCODONE-ACETAMINOPHEN 5-325 MG PO TABS
1.0000 | ORAL_TABLET | Freq: Once | ORAL | Status: AC
Start: 2014-04-12 — End: 2014-04-12
  Administered 2014-04-12: 1 via ORAL
  Filled 2014-04-12: qty 1

## 2014-04-12 MED ORDER — OXYCODONE-ACETAMINOPHEN 5-325 MG PO TABS
1.0000 | ORAL_TABLET | Freq: Four times a day (QID) | ORAL | Status: DC | PRN
Start: 2014-04-12 — End: 2015-02-23

## 2014-04-12 NOTE — ED Notes (Signed)
Pt c/o R shoulder pain after falling down a couple step this afternoon.  Pain score 8/10.  Pt denies hitting head and LOC.  Limited ROM.  Hx of shoulder surgery x "25 or so" years ago.

## 2014-04-12 NOTE — ED Provider Notes (Signed)
CSN: [id number]     Arrival date & time 04/12/14  1504 History   First MD Initiated Contact with Patient 04/12/14 1625     Chief Complaint  Patient presents with  . Fall  . Shoulder Injury     (Consider location/radiation/quality/duration/timing/severity/associated sxs/prior Treatment) Patient is a 58 y.o. male presenting with fall and shoulder injury. The history is provided by the patient.  Fall This is a new problem. The current episode started 1 to 2 hours ago. Episode frequency: once. The problem has been resolved. Pertinent negatives include no chest pain, no abdominal pain, no headaches and no shortness of breath. Nothing aggravates the symptoms. Nothing relieves the symptoms. He has tried nothing for the symptoms. The treatment provided no relief.  Shoulder Injury This is a new problem. The current episode started 1 to 2 hours ago. Episode frequency: once. The problem has not changed since onset.Pertinent negatives include no chest pain, no abdominal pain, no headaches and no shortness of breath. Nothing aggravates the symptoms. Nothing relieves the symptoms. He has tried nothing for the symptoms. The treatment provided no relief.    Past Medical History  Diagnosis Date  . Hypertension   . High cholesterol    Past Surgical History  Procedure Laterality Date  . Rotator cuff repair Right   . Hernia repair     History reviewed. No pertinent family history. History  Substance Use Topics  . Smoking status: Never Smoker   . Smokeless tobacco: Not on file  . Alcohol Use: No    Review of Systems  Constitutional: Negative for fever.  HENT: Negative for drooling and rhinorrhea.   Eyes: Negative for pain.  Respiratory: Negative for cough and shortness of breath.   Cardiovascular: Negative for chest pain and leg swelling.  Gastrointestinal: Negative for nausea, vomiting, abdominal pain and diarrhea.  Genitourinary: Negative for dysuria and hematuria.  Musculoskeletal: Negative  for gait problem and neck pain.  Skin: Negative for color change.  Neurological: Negative for numbness and headaches.  Hematological: Negative for adenopathy.  Psychiatric/Behavioral: Negative for behavioral problems.  All other systems reviewed and are negative.     Allergies  Review of patient's allergies indicates no known allergies.  Home Medications   Prior to Admission medications   Medication Sig Start Date End Date Taking? Authorizing Provider  atorvastatin (LIPITOR) 20 MG tablet Take 20 mg by mouth daily.   Yes Historical Provider, MD  losartan (COZAAR) 100 MG tablet Take 100 mg by mouth daily.   Yes Historical Provider, MD   BP 158/101  Pulse 98  Temp(Src) 98.1 F (36.7 C) (Oral)  Resp 16  SpO2 96% Physical Exam  Nursing note and vitals reviewed. Constitutional: He is oriented to person, place, and time. He appears well-developed and well-nourished.  HENT:  Head: Normocephalic and atraumatic.  Right Ear: External ear normal.  Left Ear: External ear normal.  Nose: Nose normal.  Mouth/Throat: Oropharynx is clear and moist. No oropharyngeal exudate.  Eyes: Conjunctivae and EOM are normal. Pupils are equal, round, and reactive to light.  Neck: Normal range of motion. Neck supple.  No vertebral tenderness to palpation.  Cardiovascular: Normal rate, regular rhythm, normal heart sounds and intact distal pulses.  Exam reveals no gallop and no friction rub.   No murmur heard. Pulmonary/Chest: Effort normal and breath sounds normal. No respiratory distress. He has no wheezes.  Abdominal: Soft. Bowel sounds are normal. He exhibits no distension. There is no tenderness. There is no rebound and no  guarding.  Musculoskeletal: Normal range of motion. He exhibits tenderness (mild tenderness to palpation of the right anterior shoulder with mild prominence.). He exhibits no edema.  2+ distal pulses in the upper extremities.  Normal sensory and motor skills in the right upper  extremity.  Neurological: He is alert and oriented to person, place, and time.  Skin: Skin is warm and dry.  Psychiatric: He has a normal mood and affect. His behavior is normal.    ED Course  Procedures (including critical care time) Labs Review Labs Reviewed - No data to display  Imaging Review Dg Shoulder Right  04/12/2014   CLINICAL DATA:  Right shoulder pain post fall  EXAM: RIGHT SHOULDER - 2+ VIEW  COMPARISON:  None.  FINDINGS: Three views of the right shoulder submitted. No acute fracture or subluxation. There is inferior spurring of distal clavicle. Mild degenerative changes glenohumeral joint. Spurring of humeral head. Degenerative changes AC joint.  IMPRESSION: No acute fracture or subluxation. Osteoarthritic changes as described above.   Electronically Signed   By: Natasha MeadLiviu  Pop M.D.   On: 04/12/2014 16:55     EKG Interpretation None      MDM   Final diagnoses:  Fall  Right shoulder pain    4:39 PM 58 y.o. male who presents with right shoulder pain after a fall which occurred prior to arrival. He states that he had a mechanical fall down 3 stairs and caught himself with his arms. He notes sudden onset of right shoulder pain since that time. He denies hitting his head or loss of consciousness. He appears well otherwise. Suspicious for right shoulder dislocation. He prefers not to have an IV placed. Will give IM pain medicine and get x-ray.  6:16 PM: Pain controlled. Imaging non-contrib. Will place in sling w/ frozen shoulder precautions for comfort.  I have discussed the diagnosis/risks/treatment options with the patient and believe the pt to be eligible for discharge home to follow-up with sports medicine if no better in 1 week. We also discussed returning to the ED immediately if new or worsening sx occur. We discussed the sx which are most concerning (e.g., worsening pain) that necessitate immediate return. Medications administered to the patient during their visit and any  new prescriptions provided to the patient are listed below.  Medications given during this visit Medications  HYDROmorphone (DILAUDID) injection 1 mg (1 mg Intramuscular Given 04/12/14 1646)  oxyCODONE-acetaminophen (PERCOCET/ROXICET) 5-325 MG per tablet 1 tablet (1 tablet Oral Given 04/12/14 1646)    New Prescriptions   OXYCODONE-ACETAMINOPHEN (PERCOCET) 5-325 MG PER TABLET    Take 1-2 tablets by mouth every 6 (six) hours as needed for moderate pain.       Junius ArgyleForrest S Mujtaba Bollig, MD 04/12/14 [phone]

## 2014-04-24 ENCOUNTER — Emergency Department: Payer: Self-pay | Admitting: Emergency Medicine

## 2014-05-03 ENCOUNTER — Encounter (HOSPITAL_COMMUNITY): Payer: Self-pay | Admitting: Emergency Medicine

## 2014-05-03 ENCOUNTER — Emergency Department (HOSPITAL_COMMUNITY)
Admission: EM | Admit: 2014-05-03 | Discharge: 2014-05-03 | Disposition: A | Discharge status: Home / Self Care | Payer: Self-pay | Attending: Emergency Medicine | Admitting: Emergency Medicine

## 2014-05-03 DIAGNOSIS — Y929 Unspecified place or not applicable: Secondary | ICD-10-CM | POA: Insufficient documentation

## 2014-05-03 DIAGNOSIS — S46819A Strain of other muscles, fascia and tendons at shoulder and upper arm level, unspecified arm, initial encounter: Principal | ICD-10-CM

## 2014-05-03 DIAGNOSIS — S46212A Strain of muscle, fascia and tendon of other parts of biceps, left arm, initial encounter: Secondary | ICD-10-CM

## 2014-05-03 DIAGNOSIS — Y9389 Activity, other specified: Secondary | ICD-10-CM | POA: Insufficient documentation

## 2014-05-03 DIAGNOSIS — I10 Essential (primary) hypertension: Secondary | ICD-10-CM | POA: Insufficient documentation

## 2014-05-03 DIAGNOSIS — E7800 Pure hypercholesterolemia, unspecified: Secondary | ICD-10-CM | POA: Insufficient documentation

## 2014-05-03 DIAGNOSIS — Z79899 Other long term (current) drug therapy: Secondary | ICD-10-CM | POA: Insufficient documentation

## 2014-05-03 DIAGNOSIS — S43499A Other sprain of unspecified shoulder joint, initial encounter: Secondary | ICD-10-CM | POA: Insufficient documentation

## 2014-05-03 DIAGNOSIS — X500XXA Overexertion from strenuous movement or load, initial encounter: Secondary | ICD-10-CM | POA: Insufficient documentation

## 2014-05-03 MED ORDER — OXYCODONE-ACETAMINOPHEN 5-325 MG PO TABS: 2.0000 | ORAL_TABLET | ORAL | Status: DC | PRN

## 2014-05-03 NOTE — Discharge Instructions (Signed)
Wear sling for comfort until recheck with orthopedics.  Biceps Tendon Disruption (Distal) with Rehab The biceps tendon attaches the biceps muscle to the bones of the elbow and the shoulder. A distal biceps tendon disruption is a tear of this tendon at the end the attached near the elbow. A distal biceps tendon rupture is an uncommon injury. These injuries usually involve a complete tear of the tendon from the bone; however, partial tears are also possible. The bicep muscle works with other muscles to bend the elbow and rotate the palm upward (supinate). A complete biceps rupture will result in approximately a 30% decrease in elbow bending strength and a 40% decrease in one's ability to supinate the wrist. SYMPTOMS   Pain, tenderness, swelling, warmth, or redness at the elbow, usually in the front of the elbow.  Pain that worsens with flexion of the elbow against resistance and when straightening the elbow.  Bulge can be seen and felt in the arm.  Bruising (contusion) in the elbow or forearm after 24 hours.  Limited motion of the elbow.  Weakness with attempted elbow bending (lifting or carrying) or rotation of the wrist (like when using a screwdriver).  A crackling sound (crepitation) when the tendon or elbow is moved or touched. CAUSES  A biceps tendon rupture occurs when the tendon is subjected to a force that is greater than it can withstand, such as straightening the elbow while the biceps is contracted or direct trauma (rare). RISK INCREASES WITH:   Sports that involve contact, as well as throwing sports, gymnastics, weightlifting, and bodybuilding.  Heavy labor.  Poor strength and flexibility.  Failure to warm-up properly before activity. PREVENTION  Warm up and stretch appropriately before activity.  Maintain physical fitness:  Strength, flexibility, and endurance.  Cardiovascular fitness  Allow your body to recover between practices and competition.  Learn and use  proper technique. PROGNOSIS  Surgery is usually required to fix distal biceps tendon rupture. After surgery, a recovery period of 4 to 8 months can be expected to allow for healing and a return to sports.  RELATED COMPLICATIONS  Weakness of elbow bending and forearm rotation, especially if treated non-surgically.  Prolonged disability.  Re-rupture of the tendon after surgery.  Risks of surgery, including infection, bleeding, injury to nerves, elbow or wrist stiffness or loss of motion, and weakness of elbow bending or wrist rotation. TREATMENT  Treatment initially consists of ice and medication to help reduce pain and inflammation. A sling may also be worn to increase one's comfort. Surgery is required for a full recovery and return to sports. Surgery involves reattaching the tendon to the bone. Weakness can be expected if surgery is not performed; however, this may be acceptable for sedentary individuals. Surgery is usually followed by immobilization and rehabilitation exercises to regain strength and a full range of motion.  MEDICATION  If pain medication is necessary, nonsteroidal anti-inflammatory medications, such as aspirin and ibuprofen, or other minor pain relievers, such as acetaminophen, are often recommended.  Do not take pain medication for 7 days before surgery.  Prescription pain relievers may be given if deemed necessary by your caregiver. Use only as directed and only as much as you need. HEAT AND COLD  Cold treatment (icing) relieves pain and reduces inflammation. Cold treatment should be applied for 10 to 15 minutes every 2 to 3 hours for inflammation and pain and immediately after any activity that aggravates your symptoms. Use ice packs or an ice massage.  Heat treatment may  be used prior to performing the stretching and strengthening activities prescribed by your caregiver, physical therapist, or athletic trainer. Use a heat pack or a warm soak. SEEK MEDICAL CARE IF:     Symptoms get worse or do not improve in 2 weeks despite treatment.  You experience pain, numbness, weakness, or coldness in the hand.  Blue, gray, or dark color appears in the fingernails.  Any of the following occur after surgery:  Increased pain, swelling, redness, drainage, or bleeding in the surgical area.  Signs of infection (headache, muscle aches, dizziness, or a general ill feeling with fever).  New, unexplained symptoms develop (drugs used in treatment may produce side effects). EXERCISES RANGE OF MOTION (ROM) AND STRETCHING EXERCISES - Biceps Tendon Disruption (Distal) Once your physician, physical therapist or athletic trainer has permitted you to discontinue using your brace or splint, you may begin to restore your elbow motion by using these exercises. Beginning these exercises before your provider's approval may result in delayed healing. While completing these exercises, remember:   Restoring tissue flexibility helps normal motion to return to the joints. This allows healthier, less painful movement and activity.  An effective stretch should be held for at least 30 seconds.  A stretch should never be painful. You should only feel a gentle lengthening or release in the stretched tissue. RANGE OF MOTION - Extension  Hold your right / left arm at your side and straighten your elbow as far as you can using your right / left arm muscles.  Straighten the right / left elbow farther by gently pushing down on your forearm until you feel a gentle stretch on the inside of your elbow. Hold this position for __________ seconds.  Slowly return to the starting position. Repeat __________ times. Complete this exercise __________ times per day.  RANGE OF MOTION - Flexion  Hold your right / left arm at your side and bend your elbow as far as you can using your right / left arm muscles.  Bend the right / left elbow farther by gently pushing up on your forearm until you feel a gentle  stretch on the outside of your elbow. Hold this position for __________ seconds.  Slowly return to the starting position. Repeat __________ times. Complete this exercise __________ times per day.  RANGE OF MOTION - Supination, Active   Stand or sit with your elbows at your side. Bend your right / left elbow to 90 degrees.  Turn your palm upward until you feel a gentle stretch on the inside of your forearm.  Hold this position for __________ seconds. Slowly release and return to the starting position. Repeat __________ times. Complete this stretch __________ times per day.  RANGE OF MOTION - Pronation, Active   Stand or sit with your elbows at your side. Bend your right / left elbow to 90 degrees.  Turn your palm downward until you feel a gentle stretch on the top of your forearm.  Hold this position for __________ seconds. Slowly release and return to the starting position. Repeat __________ times. Complete this stretch __________ times per day.  STRENGTHENING EXERCISES - Biceps Tendon Disruption (Distal) Once your physician, physical therapist, or athletic trainer has permitted you to discontinue using your brace or splint, you may begin restoring your arm strength by using these exercises. Beginning these before your provider's approval may result in delayed healing. While completing these exercises, remember:   Muscles can gain both the endurance and the strength needed for everyday activities through  controlled exercises.  Complete these exercises as instructed by your physician, physical therapist, or athletic trainer. Progress the resistance and repetitions only as guided.  You may experience muscle soreness or fatigue, but the pain or discomfort you are trying to eliminate should never worsen during these exercises. If this pain does worsen, stop and make certain you are following the directions exactly. If the pain is still present after adjustments, discontinue the exercise until  you can discuss the trouble with your clinician. STRENGTH - Elbow Flexors, Isometric   Stand or sit upright on a firm surface. Place your right / left arm so that your hand is palm-up and at the height of your waist.  Place your opposite hand on top of your forearm. Gently push down as your right / left arm resists. Push as hard as you can with both arms without causing any pain or movement at your right / left elbow. Hold this stationary position for __________ seconds.  Gradually release the tension in both arms. Allow your muscles to relax completely before repeating. Repeat __________ times. Complete this exercise __________ times per day. STRENGTH - Elbow Extensors, Isometric   Stand or sit upright on a firm surface. Place your right / left arm so that your palm faces your abdomen and it is at the height of your waist.  Place your opposite hand on the underside of your forearm. Gently push up as your right / left arm resists. Push as hard as you can with both arms without causing any pain or movement at your right / left elbow. Hold this stationary position for __________ seconds.  Gradually release the tension in both arms. Allow your muscles to relax completely before repeating. Repeat __________ times. Complete this exercise __________ times per day. STRENGTH - Elbow Flexors, Supinated  With good posture, stand or sit on a firm chair without armrests. Allow your right / left arm to rest at your side with your palm facing forward.  Holding a __________ weight or gripping a rubber exercise band/tubing, bring your right / left hand toward your shoulder.  Allow your muscles to control the resistance as your hand returns to your side. Repeat __________ times. Complete this exercise __________ times per day.  STRENGTH - Elbow Flexors, Neutral  With good posture, stand or sit on a firm chair without armrests. Allow your right / left arm to rest at your side with your thumb facing  forward.  Holding a __________ weight or gripping a rubber exercise band/tubing, bring your right / left hand toward your shoulder.  Allow your muscles to control the resistance as your hand returns to your side. Repeat __________ times. Complete this exercise __________ times per day.  STRENGTH - Elbow Extensors  Lie on your back. Extend your right / left elbow into the air, pointing it toward the ceiling. Brace your arm with your opposite hand.*  Holding a __________ weight in your hand, slowly straighten your right / left elbow.  Allow your muscles to control the weight as your hand returns to its starting position. Repeat __________ times. Complete this exercise __________ times per day. *You may also stand with your elbow overhead and pointed toward the ceiling and supported by your opposite hand. STRENGTH - Elbow Extensors, Dynamic  With good posture, stand or sit on a firm chair without armrests. Keeping your upper arms at your side, bring both hands up to your right / left shoulder while gripping a rubber exercise band/tubing. Your right / left  hand should be just below the other hand.  Straighten your right / left elbow. Hold for __________ seconds.  Allow your muscles to control the rubber exercise band/tubing as your hand returns to your shoulder. Repeat __________ times. Complete this exercise __________ times per day. STRENGTH - Forearm Supinators   Sit with your right / left forearm supported on a table, keeping your elbow below shoulder height. Rest your hand over the edge, palm down.  Gently grip a hammer or a soup ladle.  Without moving your elbow, slowly turn your palm and hand upward to a "thumbs-up" position.  Hold this position for __________ seconds. Slowly return to the starting position. Repeat __________ times. Complete this exercise __________ times per day.  STRENGTH - Forearm Pronators   Sit with your right / left forearm supported on a table, keeping  your elbow below shoulder height. Rest your hand over the edge, palm up.  Gently grip a hammer or a soup ladle.  Without moving your elbow, slowly turn your palm and hand upward to a "thumbs-up" position.  Hold this position for __________ seconds. Slowly return to the starting position. Repeat __________ times. Complete this exercise __________ times per day.  Document Released: 10/17/2005 Document Revised: 01/09/2012 Document Reviewed: 01/29/2009 [facility] Patient Information 2015 [employer], [place]. This information is not intended to replace advice given to you by your health care provider. Make sure you discuss any questions you have with your health care provider.  Surgery for Biceps Tendon Disruption (Distal) with Rehab Any patient with a complete rupture of their distal biceps tendon is a candidate for surgery to repair the injury. A complete biceps rupture will result in approximately a 30% decrease in elbow bending strength and a 40% decrease in one's ability to rotate the palm upward (supinate). The timing of surgery is very important for one's chances of a complete recovery. Surgery should be performed as soon as possible after injury (usually within 3 weeks). The reason for this is that a delay may result in an inability to reattach the tendon to the bone. The goal of biceps tendon rupture surgery is to regain full function of the biceps muscle. REASONS NOT TO OPERATE  Infection near the site of injury.  No functional impairment (sedentary persons).  Inability or unwillingness to complete a postoperative rehabilitation program. RISKS AND COMPLICATIONS  Wound infection.  Bleeding or injury to blood vessels.  Nerve damage resulting in numbness, weakness, or paralysis of the elbow, forearm, and hand.  Re-rupture of the tendon from the bone.  Elbow or wrist and forearm stiffness and loss of some or all motion at the elbow, wrist, or forearm. TECHNIQUE There are multiple surgical  techniques that exist for this procedure. A common technique involves two incisions; one in the front to find the tendon and the other on the outer elbow to make a hole (trough) in the bone and sew the tendon into the bone using heavy thread (suture). Another technique requires only one incision in the front of the elbow. This technique reattaches the tendon in the same location it ruptured from by the use of screws or devices similar to toggle bolts (bone anchors). These bone anchors are used to sew the tendon onto the bone with heavy sutures.  HOME CARE INSTRUCTIONS   Postoperative management surgery varies depending on the surgical technique and the therapist.  The elbow is typically immobilized in a splint, cast, or brace for 3 to 9 weeks.  As often as possible, keep  the arm and elbow elevated above heart level for the first 1 to 2 weeks after surgery.  You will be given pain medications by your caregiver.  A sling may be given for comfort after surgery.  Postoperative rehabilitation and exercises are very important to regain motion and then strength. RETURN TO SPORTS  The time required before returning to sports depends on the type of sport, the position played, and the quality of ligaments at the time of repair. Your therapist will make this decision.  A minimum of 3 to 6 months is necessary after surgery before returning to sports.  Full elbow and wrist motion and strength are necessary before returning to sports. SEEK MEDICAL CARE IF:   You experience pain, numbness, or coldness in the hand.  Blue, gray, or dark color appears in the fingernails.  Any of the following occur after surgery:  Increased pain, swelling, redness, drainage, or bleeding in the surgical area.  Signs of infection (headache, muscle aches, dizziness, or a general ill feeling with fever).  New, unexplained symptoms develop (drugs used in treatment may produce side effects). Do not eat or drink anything  before surgery. Solid food makes general anesthesia more hazardous.  EXERCISES RANGE OF MOTION (ROM) AND STRETCHING EXERCISES - Biceps Tendon Disruption (Distal), Surgery for Once your physician, physical therapist, or athletic trainer has permitted you to come out of your brace or splint, you may begin to restore your elbow motion by using these exercises. Beginning these before your provider's approval may result in delayed healing. While completing these exercises, remember:   Restoring tissue flexibility helps normal motion to return to the joints. This allows healthier, less painful movement and activity.  An effective stretch should be held for at least 30 seconds.  A stretch should never be painful. You should only feel a gentle lengthening or release in the stretched tissue. RANGE OF MOTION - Extension  Hold your right / left arm at your side and straighten your elbow as far as you can using your right / left arm muscles.  Straighten the right / left elbow farther by gently pushing down on your forearm until you feel a gentle stretch on the inside of your elbow. Hold this position for __________ seconds.  Slowly return to the starting position. Repeat __________ times. Complete this exercise __________ times per day.  RANGE OF MOTION - Flexion  Hold your right / left arm at your side and bend your elbow as far as you can using your right / left arm muscles.  Bend the right / left elbow farther by gently pushing up on your forearm until you feel a gentle stretch on the outside of your elbow. Hold this position for __________ seconds.  Slowly return to the starting position. Repeat __________ times. Complete this exercise __________ times per day.  RANGE OF MOTION - Supination, Active   Stand or sit with your elbows at your side. Bend your right / left elbow to 90 degrees.  Turn your palm upward until you feel a gentle stretch on the inside of your forearm.  Hold this position  for __________ seconds. Slowly release and return to the starting position. Repeat __________ times. Complete this stretch __________ times per day.  RANGE OF MOTION - Pronation, Active   Stand or sit with your elbows at your side. Bend your right / left elbow to 90 degrees.  Turn your palm downward until you feel a gentle stretch on the top of your forearm.  Hold this position for __________ seconds. Slowly release and return to the starting position. Repeat __________ times. Complete this stretch __________ times per day.  STRENGTHENING EXERCISES - Biceps Tendon Disruption (Distal), Surgery For Once your physician, physical therapist, or athletic trainer has permitted you to come out of your brace or splint, you may begin to restore your elbow motion by using these exercises. You will likely begin these exercises no earlier than 8 to 12 weeks after your surgery. Beginning these before your provider's approval may result in delayed healing. While completing these exercises, remember:   Muscles can gain both the endurance and the strength needed for everyday activities through controlled exercises.  Complete these exercises as instructed by your physician, physical therapist or athletic trainer. Progress the resistance and repetitions only as guided.  You may experience muscle soreness or fatigue, but the pain or discomfort you are trying to eliminate should never worsen during these exercises. If this pain does worsen, stop and make certain you are following the directions exactly. If the pain is still present after adjustments, discontinue the exercise until you can discuss the trouble with your clinician. STRENGTH - Elbow Flexors, Isometric   Stand or sit upright on a firm surface. Place your right / left arm so that your hand is palm-up and at the height of your waist.  Place your opposite hand on top of your forearm. Gently push down as your right / left arm resists. Push as hard as you  can with both arms without causing any pain or movement at your right / left elbow. Hold this stationary position for __________ seconds.  Gradually release the tension in both arms. Allow your muscles to relax completely before repeating. Repeat __________ times. Complete this exercise __________ times per day. STRENGTH - Elbow Extensors, Isometric   Stand or sit upright on a firm surface. Place your right / left arm so that your palm faces your abdomen and it is at the height of your waist.  Place your opposite hand on the underside of your forearm. Gently push up as your right / left arm resists. Push as hard as you can with both arms without causing any pain or movement at your right / left elbow. Hold this stationary position for __________ seconds.  Gradually release the tension in both arms. Allow your muscles to relax completely before repeating. Repeat __________ times. Complete this exercise __________ times per day. STRENGTH - Elbow Flexors, Supinated  With good posture, stand or sit on a firm chair without armrests. Allow your right / left arm to rest at your side with your palm facing forward.  Holding a __________weight or gripping a rubber exercise band/tubing, bring your hand toward your shoulder.  Allow your muscles to control the resistance as your hand returns to your side. Repeat __________ times. Complete this exercise __________ times per day.  STRENGTH - Elbow Flexors, Neutral  With good posture, stand or sit on a firm chair without armrests. Allow your right / left arm to rest at your side with your thumb facing forward.  Holding a __________ weight or gripping a rubber exercise band/tubing, bring your hand toward your shoulder.  Allow your muscles to control the resistance as your hand returns to your side. Repeat __________ times. Complete this exercise __________ times per day.  STRENGTH - Elbow Extensors  Lie on your back. Extend your right / left elbow into  the air, pointing it toward the ceiling. Brace your arm with your opposite  hand.*  Holding a __________ weight in your hand, slowly straighten your right / left elbow.  Allow your muscles to control the weight as your hand returns to its starting position. Repeat __________ times. Complete this exercise __________ times per day. *You may also stand with your elbow overhead and pointed toward the ceiling and supported by your opposite hand. STRENGTH - Elbow Extensors, Dynamic  With good posture, stand or sit on a firm chair without armrests. Keeping your upper arms at your side, bring both hands up to your right / left shoulder while gripping a rubber exercise band/tubing. Your right / left hand should be just below the other hand.  Straighten your right / left elbow. Hold for __________ seconds.  Allow your muscles to control the rubber exercise band/tubing as your hand returns to your shoulder. Repeat __________ times. Complete this exercise __________ times per day. STRENGTH - Forearm Supinators   Sit with your right / left forearm supported on a table, keeping your elbow below shoulder height. Rest your hand over the edge, palm down.  Gently grip a hammer or a soup ladle.  Without moving your elbow, slowly turn your palm and hand upward to a "thumbs-up" position.  Hold this position for __________ seconds. Slowly return to the starting position. Repeat __________ times. Complete this exercise __________ times per day.  STRENGTH - Forearm Pronators   Sit with your right / left forearm supported on a table, keeping your elbow below shoulder height. Rest your hand over the edge, palm up.  Gently grip a hammer or a soup ladle.  Without moving your elbow, slowly turn your palm and hand upward to a "thumbs-up" position.  Hold this position for __________ seconds. Slowly return to the starting position. Repeat __________ times. Complete this exercise __________ times per day.  Document  Released: 10/17/2005 Document Revised: 01/09/2012 Document Reviewed: 01/29/2009 [facility] Patient Information 2015 [place], [place]. This information is not intended to replace advice given to you by your health care provider. Make sure you discuss any questions you have with your health care provider.

## 2014-05-03 NOTE — ED Notes (Signed)
Patient was lifting couch onto truck and felt a pop in his left bicep. Patient now having trouble moving arm and having 8/10 pain. CNS intact.

## 2014-05-03 NOTE — ED Provider Notes (Signed)
CSN: [id number]     Arrival date & time 05/03/14  0645 History   First MD Initiated Contact with Patient 05/03/14 [phone]     Chief Complaint  Patient presents with  . 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 There is no treatment prior to arrival. His pain was severe when it started and is now moderately severe worse with movement and palpation. He is no radiation of his pain or associated symptoms other than weakness of his left biceps. Past Medical History  Diagnosis Date  . Hypertension   . High cholesterol    Past Surgical History  Procedure Laterality Date  . Rotator cuff repair Right   . Hernia repair     No family history on file. History  Substance Use Topics  . Smoking status: Never Smoker   . Smokeless tobacco: Not on file  . Alcohol Use: No    Review of Systems See HPI.   Allergies  Review of patient's allergies indicates no known allergies.  Home Medications   Prior to Admission medications   Medication Sig Start Date End Date Taking? Authorizing Provider  atorvastatin (LIPITOR) 20 MG tablet Take 20 mg by mouth daily.   Yes Historical Provider, MD  losartan (COZAAR) 100 MG tablet Take 100 mg by mouth daily.   Yes Historical Provider, MD  oxyCODONE-acetaminophen (PERCOCET) 5-325 MG per tablet Take 1-2 tablets by mouth every 6 (six) hours as needed  for moderate pain. 04/12/14  Yes Junius ArgyleForrest S Harrison, MD  oxyCODONE-acetaminophen (PERCOCET) 5-325 MG per tablet Take 2 tablets by mouth every 4 (four) hours as needed for severe pain. 05/03/14   Hurman HornJohn M Fredia Chittenden, MD   BP 136/89  Pulse 89  Temp(Src) 98 F (36.7 C) (Oral)  Resp 16  Ht 6\' 4"  (1.93 m)  Wt 270 lb (122.471 kg)  BMI 32.88 kg/m2  SpO2 97% Physical Exam  Nursing note and vitals reviewed. Constitutional:  Awake, alert, nontoxic appearance.  HENT:  Head: Atraumatic.  Eyes: Right eye exhibits no discharge. Left eye exhibits no discharge.  Neck: Neck supple.  Cervical spine nontender  Pulmonary/Chest: Effort normal. He exhibits no tenderness.  Abdominal: Soft. There is no tenderness. There is no rebound.  Musculoskeletal: He exhibits tenderness.  Baseline ROM, no obvious new focal weakness. Right arm and both legs are nontender. Left arm has no tenderness to the shoulder joint elbow joint wrist or hand. Radial pulse intact left wrist. Left hand has capillary refill less than 2 seconds with normal light touch and intact strength in the distributions of the median radial and ulnar nerve function. His left biceps is tender and deformed with absence of left distal biceps tendon with clinical diagnosis of left distal biceps tendon rupture with weakness of left arm in flexion at the elbow.  Neurological: He is alert.  Mental status and motor strength appears baseline for patient and  situation.  Skin: No rash noted.  Psychiatric: He has a normal mood and affect.    ED Course  Procedures (including critical care time) Patient informed of clinical course, understand medical decision-making process, and agree with plan. Labs Review Labs Reviewed - No data to display  Imaging Review No results found.   EKG Interpretation None      MDM   Final diagnoses:  Rupture of distal biceps tendon, left, initial encounter    I doubt any other EMC precluding discharge at this time including,  but not necessarily limited to the following:fracture.    Hurman HornJohn M Garren Greenman, MD 05/03/14 [phone]

## 2015-02-23 ENCOUNTER — Emergency Department (HOSPITAL_COMMUNITY)
Admission: EM | Admit: 2015-02-23 | Discharge: 2015-02-23 | Disposition: A | Discharge status: Home / Self Care | Payer: Self-pay | Attending: Emergency Medicine | Admitting: Emergency Medicine

## 2015-02-23 ENCOUNTER — Encounter (HOSPITAL_COMMUNITY): Payer: Self-pay | Admitting: Emergency Medicine

## 2015-02-23 DIAGNOSIS — X58XXXA Exposure to other specified factors, initial encounter: Secondary | ICD-10-CM | POA: Insufficient documentation

## 2015-02-23 DIAGNOSIS — Z79899 Other long term (current) drug therapy: Secondary | ICD-10-CM | POA: Insufficient documentation

## 2015-02-23 DIAGNOSIS — Y9289 Other specified places as the place of occurrence of the external cause: Secondary | ICD-10-CM | POA: Insufficient documentation

## 2015-02-23 DIAGNOSIS — E780 Pure hypercholesterolemia: Secondary | ICD-10-CM | POA: Insufficient documentation

## 2015-02-23 DIAGNOSIS — S46212A Strain of muscle, fascia and tendon of other parts of biceps, left arm, initial encounter: Secondary | ICD-10-CM

## 2015-02-23 DIAGNOSIS — I10 Essential (primary) hypertension: Secondary | ICD-10-CM | POA: Insufficient documentation

## 2015-02-23 DIAGNOSIS — Y9389 Activity, other specified: Secondary | ICD-10-CM | POA: Insufficient documentation

## 2015-02-23 DIAGNOSIS — S46112A Strain of muscle, fascia and tendon of long head of biceps, left arm, initial encounter: Secondary | ICD-10-CM | POA: Insufficient documentation

## 2015-02-23 DIAGNOSIS — Y998 Other external cause status: Secondary | ICD-10-CM | POA: Insufficient documentation

## 2015-02-23 MED ORDER — OXYCODONE-ACETAMINOPHEN 5-325 MG PO TABS: 1.0000 | ORAL_TABLET | Freq: Four times a day (QID) | ORAL | Status: AC | PRN

## 2015-02-23 MED ORDER — OXYCODONE-ACETAMINOPHEN 5-325 MG PO TABS
1.0000 | ORAL_TABLET | Freq: Once | ORAL | Status: AC
  Administered 2015-02-23: 1 via ORAL
  Filled 2015-02-23: qty 1

## 2015-02-23 NOTE — ED Provider Notes (Signed)
CSN: [id number]     Arrival date & time 02/23/15  1417 History  This chart was scribed for non-physician practitioner, Teressa Lower, NP, working with Shon Baton, MD, by Lionel December, ED Scribe. This patient was seen in room WTR7/WTR7 and the patient's care was started at 2:35 PM.    Chief Complaint  Patient presents with  . Arm Injury    bicep injury-l/arm    Patient is a 59 y.o. male presenting with arm injury. The history is provided by the patient. No language interpreter was used.  Arm Injury   HPI Comments: Cole Carey is a 59 y.o. male who presents to the Emergency Department complaining of left bicep pain onset prior to arrival when he was moving a flower pot and heard a pop.  He states that he is not able to flex it currently.  He has no prior injury to his bicep.  He has no other complaints today.     Past Medical History  Diagnosis Date  . Hypertension   . High cholesterol    Past Surgical History  Procedure Laterality Date  . Rotator cuff repair Right   . Hernia repair     No family history on file. History  Substance Use Topics  . Smoking status: Never Smoker   . Smokeless tobacco: Not on file  . Alcohol Use: No    Review of Systems  Musculoskeletal: Positive for myalgias.  All other systems reviewed and are negative.     Allergies  Review of patient's allergies indicates no known allergies.  Home Medications   Prior to Admission medications   Medication Sig Start Date End Date Taking? Authorizing Provider  atorvastatin (LIPITOR) 20 MG tablet Take 20 mg by mouth daily.    Historical Provider, MD  losartan (COZAAR) 100 MG tablet Take 100 mg by mouth daily.    Historical Provider, MD  oxyCODONE-acetaminophen (PERCOCET) 5-325 MG per tablet Take 1-2 tablets by mouth every 6 (six) hours as needed for moderate pain. 04/12/14   Purvis Sheffield, MD  oxyCODONE-acetaminophen (PERCOCET) 5-325 MG per tablet Take 2 tablets by mouth every 4 (four)  hours as needed for severe pain. 05/03/14   Wayland Salinas, MD    Physical Exam  Constitutional: He is oriented to person, place, and time. He appears well-developed and well-nourished. No distress.  HENT:  Head: Normocephalic and atraumatic.  Eyes: Conjunctivae and EOM are normal.  Neck: Neck supple.  Cardiovascular: Normal rate.   Pulmonary/Chest: Effort normal. No respiratory distress.  Musculoskeletal: Normal range of motion.  Obvious swelling to right distal upper arm. Unable to flex. Pulses intact  Neurological: He is alert and oriented to person, place, and time.  Skin: Skin is warm and dry.  Psychiatric: He has a normal mood and affect. His behavior is normal.  Nursing note and vitals reviewed.   ED Course  Procedures (including critical care time) DIAGNOSTIC STUDIES: Oxygen Saturation is 98% on RA, normal by my interpretation.    COORDINATION OF CARE: 2:39 PM Discussed treatment plan with patient at beside, the patient agrees with the plan and has no further questions at this time.   Labs Review Labs Reviewed - No data to display  Imaging Review No results found.   EKG Interpretation None      MDM   Final diagnoses:  Biceps tendon rupture, left, initial encounter   Exam consistent with rupture. Pt given ortho follow up and pain medication. No imaging needed at this time  I personally performed the services described in this documentation, which was scribed in my presence. The recorded information has been reviewed and is accurate.    Teressa LowerVrinda Jayra Choyce, NP 02/23/15 1453  Shon Batonourtney F Horton, MD 02/24/15 [phone]

## 2015-02-23 NOTE — Discharge Instructions (Signed)
Biceps Tendon Disruption (Distal) with Rehab The biceps tendon attaches the biceps muscle to the bones of the elbow and the shoulder. A distal biceps tendon disruption is a tear of this tendon at the end the attached near the elbow. A distal biceps tendon rupture is an uncommon injury. These injuries usually involve a complete tear of the tendon from the bone; however, partial tears are also possible. The bicep muscle works with other muscles to bend the elbow and rotate the palm upward (supinate). A complete biceps rupture will result in approximately a 30% decrease in elbow bending strength and a 40% decrease in one's ability to supinate the wrist. SYMPTOMS   Pain, tenderness, swelling, warmth, or redness at the elbow, usually in the front of the elbow.  Pain that worsens with flexion of the elbow against resistance and when straightening the elbow.  Bulge can be seen and felt in the arm.  Bruising (contusion) in the elbow or forearm after 24 hours.  Limited motion of the elbow.  Weakness with attempted elbow bending (lifting or carrying) or rotation of the wrist (like when using a screwdriver).  A crackling sound (crepitation) when the tendon or elbow is moved or touched. CAUSES  A biceps tendon rupture occurs when the tendon is subjected to a force that is greater than it can withstand, such as straightening the elbow while the biceps is contracted or direct trauma (rare). RISK INCREASES WITH:   Sports that involve contact, as well as throwing sports, gymnastics, weightlifting, and bodybuilding.  Heavy labor.  Poor strength and flexibility.  Failure to warm-up properly before activity. PREVENTION  Warm up and stretch appropriately before activity.  Maintain physical fitness:  Strength, flexibility, and endurance.  Cardiovascular fitness  Allow your body to recover between practices and competition.  Learn and use proper technique. PROGNOSIS  Surgery is usually required  to fix distal biceps tendon rupture. After surgery, a recovery period of 4 to 8 months can be expected to allow for healing and a return to sports.  RELATED COMPLICATIONS  Weakness of elbow bending and forearm rotation, especially if treated non-surgically.  Prolonged disability.  Re-rupture of the tendon after surgery.  Risks of surgery, including infection, bleeding, injury to nerves, elbow or wrist stiffness or loss of motion, and weakness of elbow bending or wrist rotation. TREATMENT  Treatment initially consists of ice and medication to help reduce pain and inflammation. A sling may also be worn to increase one's comfort. Surgery is required for a full recovery and return to sports. Surgery involves reattaching the tendon to the bone. Weakness can be expected if surgery is not performed; however, this may be acceptable for sedentary individuals. Surgery is usually followed by immobilization and rehabilitation exercises to regain strength and a full range of motion.  MEDICATION  If pain medication is necessary, nonsteroidal anti-inflammatory medications, such as aspirin and ibuprofen, or other minor pain relievers, such as acetaminophen, are often recommended.  Do not take pain medication for 7 days before surgery.  Prescription pain relievers may be given if deemed necessary by your caregiver. Use only as directed and only as much as you need. HEAT AND COLD  Cold treatment (icing) relieves pain and reduces inflammation. Cold treatment should be applied for 10 to 15 minutes every 2 to 3 hours for inflammation and pain and immediately after any activity that aggravates your symptoms. Use ice packs or an ice massage.  Heat treatment may be used prior to performing the stretching and strengthening   activities prescribed by your caregiver, physical therapist, or athletic trainer. Use a heat pack or a warm soak. SEEK MEDICAL CARE IF:   Symptoms get worse or do not improve in 2 weeks despite  treatment.  You experience pain, numbness, or coldness in the hand.  Blue, gray, or dark color appears in the fingernails.  Any of the following occur after surgery:  Increased pain, swelling, redness, drainage, or bleeding in the surgical area.  Signs of infection (headache, muscle aches, dizziness, or a general ill feeling with fever).  New, unexplained symptoms develop (drugs used in treatment may produce side effects). EXERCISES RANGE OF MOTION (ROM) AND STRETCHING EXERCISES - Biceps Tendon Disruption (Distal) Once your physician, physical therapist or athletic trainer has permitted you to discontinue using your brace or splint, you may begin to restore your elbow motion by using these exercises. Beginning these exercises before your provider's approval may result in delayed healing. While completing these exercises, remember:   Restoring tissue flexibility helps normal motion to return to the joints. This allows healthier, less painful movement and activity.  An effective stretch should be held for at least 30 seconds.  A stretch should never be painful. You should only feel a gentle lengthening or release in the stretched tissue. RANGE OF MOTION - Extension  Hold your right / left arm at your side and straighten your elbow as far as you can using your right / left arm muscles.  Straighten the right / left elbow farther by gently pushing down on your forearm until you feel a gentle stretch on the inside of your elbow. Hold this position for __________ seconds.  Slowly return to the starting position. Repeat __________ times. Complete this exercise __________ times per day.  RANGE OF MOTION - Flexion  Hold your right / left arm at your side and bend your elbow as far as you can using your right / left arm muscles.  Bend the right / left elbow farther by gently pushing up on your forearm until you feel a gentle stretch on the outside of your elbow. Hold this position for  __________ seconds.  Slowly return to the starting position. Repeat __________ times. Complete this exercise __________ times per day.  RANGE OF MOTION - Supination, Active   Stand or sit with your elbows at your side. Bend your right / left elbow to 90 degrees.  Turn your palm upward until you feel a gentle stretch on the inside of your forearm.  Hold this position for __________ seconds. Slowly release and return to the starting position. Repeat __________ times. Complete this stretch __________ times per day.  RANGE OF MOTION - Pronation, Active   Stand or sit with your elbows at your side. Bend your right / left elbow to 90 degrees.  Turn your palm downward until you feel a gentle stretch on the top of your forearm.  Hold this position for __________ seconds. Slowly release and return to the starting position. Repeat __________ times. Complete this stretch __________ times per day.  STRENGTHENING EXERCISES - Biceps Tendon Disruption (Distal) Once your physician, physical therapist, or athletic trainer has permitted you to discontinue using your brace or splint, you may begin restoring your arm strength by using these exercises. Beginning these before your provider's approval may result in delayed healing. While completing these exercises, remember:   Muscles can gain both the endurance and the strength needed for everyday activities through controlled exercises.  Complete these exercises as instructed by your physician,   physical therapist, or athletic trainer. Progress the resistance and repetitions only as guided.  You may experience muscle soreness or fatigue, but the pain or discomfort you are trying to eliminate should never worsen during these exercises. If this pain does worsen, stop and make certain you are following the directions exactly. If the pain is still present after adjustments, discontinue the exercise until you can discuss the trouble with your clinician. STRENGTH -  Elbow Flexors, Isometric   Stand or sit upright on a firm surface. Place your right / left arm so that your hand is palm-up and at the height of your waist.  Place your opposite hand on top of your forearm. Gently push down as your right / left arm resists. Push as hard as you can with both arms without causing any pain or movement at your right / left elbow. Hold this stationary position for __________ seconds.  Gradually release the tension in both arms. Allow your muscles to relax completely before repeating. Repeat __________ times. Complete this exercise __________ times per day. STRENGTH - Elbow Extensors, Isometric   Stand or sit upright on a firm surface. Place your right / left arm so that your palm faces your abdomen and it is at the height of your waist.  Place your opposite hand on the underside of your forearm. Gently push up as your right / left arm resists. Push as hard as you can with both arms without causing any pain or movement at your right / left elbow. Hold this stationary position for __________ seconds.  Gradually release the tension in both arms. Allow your muscles to relax completely before repeating. Repeat __________ times. Complete this exercise __________ times per day. STRENGTH - Elbow Flexors, Supinated  With good posture, stand or sit on a firm chair without armrests. Allow your right / left arm to rest at your side with your palm facing forward.  Holding a __________ weight or gripping a rubber exercise band/tubing, bring your right / left hand toward your shoulder.  Allow your muscles to control the resistance as your hand returns to your side. Repeat __________ times. Complete this exercise __________ times per day.  STRENGTH - Elbow Flexors, Neutral  With good posture, stand or sit on a firm chair without armrests. Allow your right / left arm to rest at your side with your thumb facing forward.  Holding a __________ weight or gripping a rubber exercise  band/tubing, bring your right / left hand toward your shoulder.  Allow your muscles to control the resistance as your hand returns to your side. Repeat __________ times. Complete this exercise __________ times per day.  STRENGTH - Elbow Extensors  Lie on your back. Extend your right / left elbow into the air, pointing it toward the ceiling. Brace your arm with your opposite hand.*  Holding a __________ weight in your hand, slowly straighten your right / left elbow.  Allow your muscles to control the weight as your hand returns to its starting position. Repeat __________ times. Complete this exercise __________ times per day. *You may also stand with your elbow overhead and pointed toward the ceiling and supported by your opposite hand. STRENGTH - Elbow Extensors, Dynamic  With good posture, stand or sit on a firm chair without armrests. Keeping your upper arms at your side, bring both hands up to your right / left shoulder while gripping a rubber exercise band/tubing. Your right / left hand should be just below the other hand.  Straighten your   right / left elbow. Hold for __________ seconds.  Allow your muscles to control the rubber exercise band/tubing as your hand returns to your shoulder. Repeat __________ times. Complete this exercise __________ times per day. STRENGTH - Forearm Supinators   Sit with your right / left forearm supported on a table, keeping your elbow below shoulder height. Rest your hand over the edge, palm down.  Gently grip a hammer or a soup ladle.  Without moving your elbow, slowly turn your palm and hand upward to a "thumbs-up" position.  Hold this position for __________ seconds. Slowly return to the starting position. Repeat __________ times. Complete this exercise __________ times per day.  STRENGTH - Forearm Pronators   Sit with your right / left forearm supported on a table, keeping your elbow below shoulder height. Rest your hand over the edge, palm  up.  Gently grip a hammer or a soup ladle.  Without moving your elbow, slowly turn your palm and hand upward to a "thumbs-up" position.  Hold this position for __________ seconds. Slowly return to the starting position. Repeat __________ times. Complete this exercise __________ times per day.  Document Released: 10/17/2005 Document Revised: 01/09/2012 Document Reviewed: 01/29/2009 [employer] Patient Information 2015 [employer]. This information is not intended to replace advice given to you by your health care provider. Make sure you discuss any questions you have with your health care provider.  

## 2015-02-23 NOTE — ED Notes (Signed)
Pt reports possible l/bicep injury. Obvious deformity to l/uppper arm, anterior above the elbow. Pt had been lifting heavy object when he felt a popping sensation in l/upper arm

## 2015-04-25 IMAGING — CR DG SHOULDER 2+V*R*
3 series · 3 of 3 positions shown · non-contrast
Comparison: None.

CLINICAL DATA: Right shoulder pain post fall

EXAM:
RIGHT SHOULDER - 2+ VIEW

[w shoulder external right]
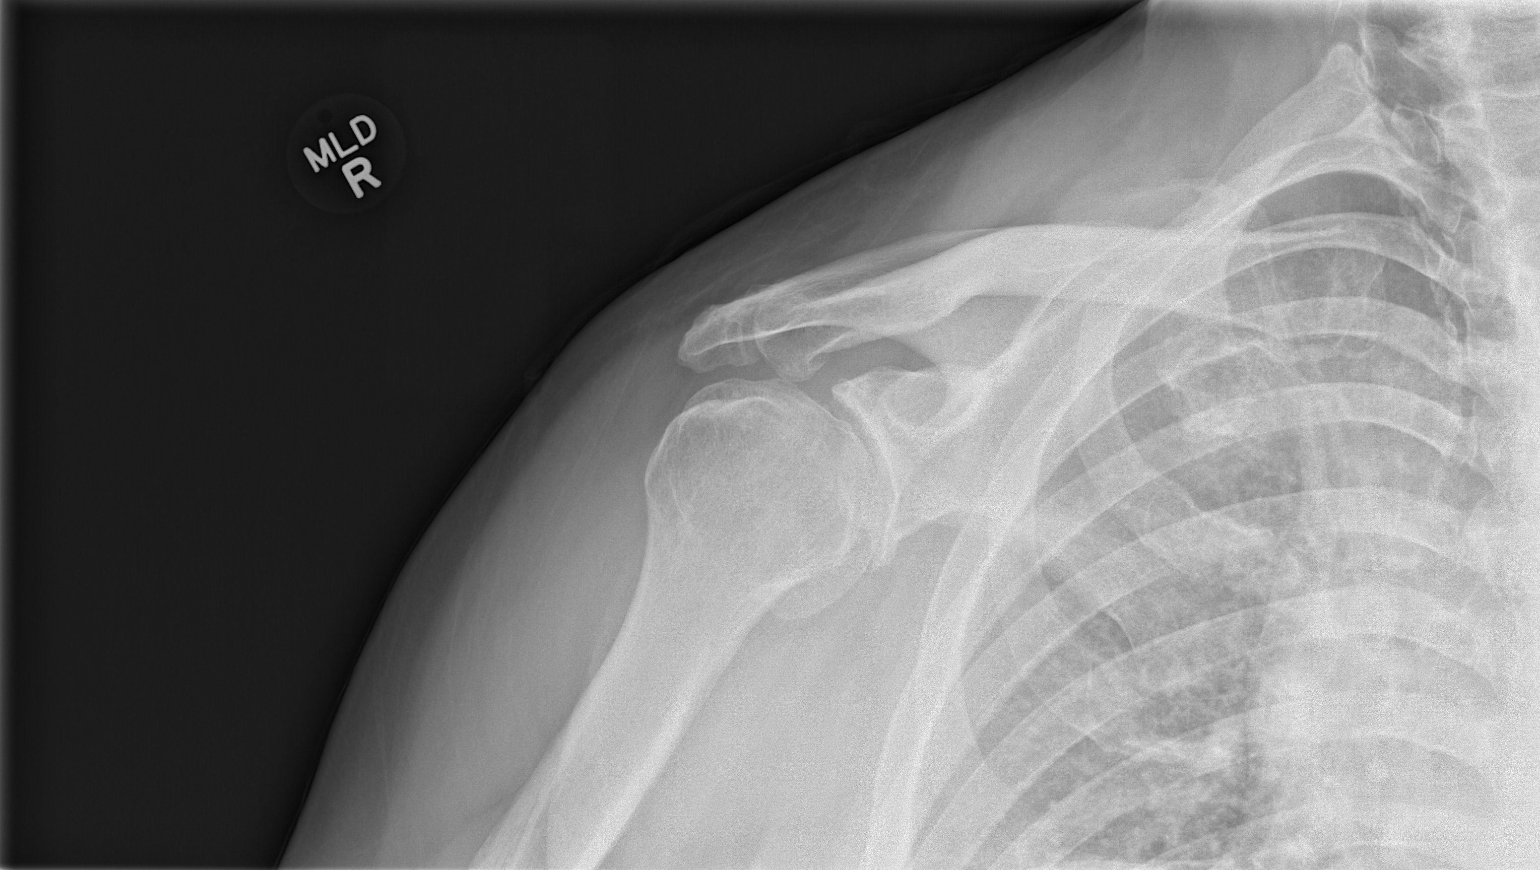

[w shoulder internal right]
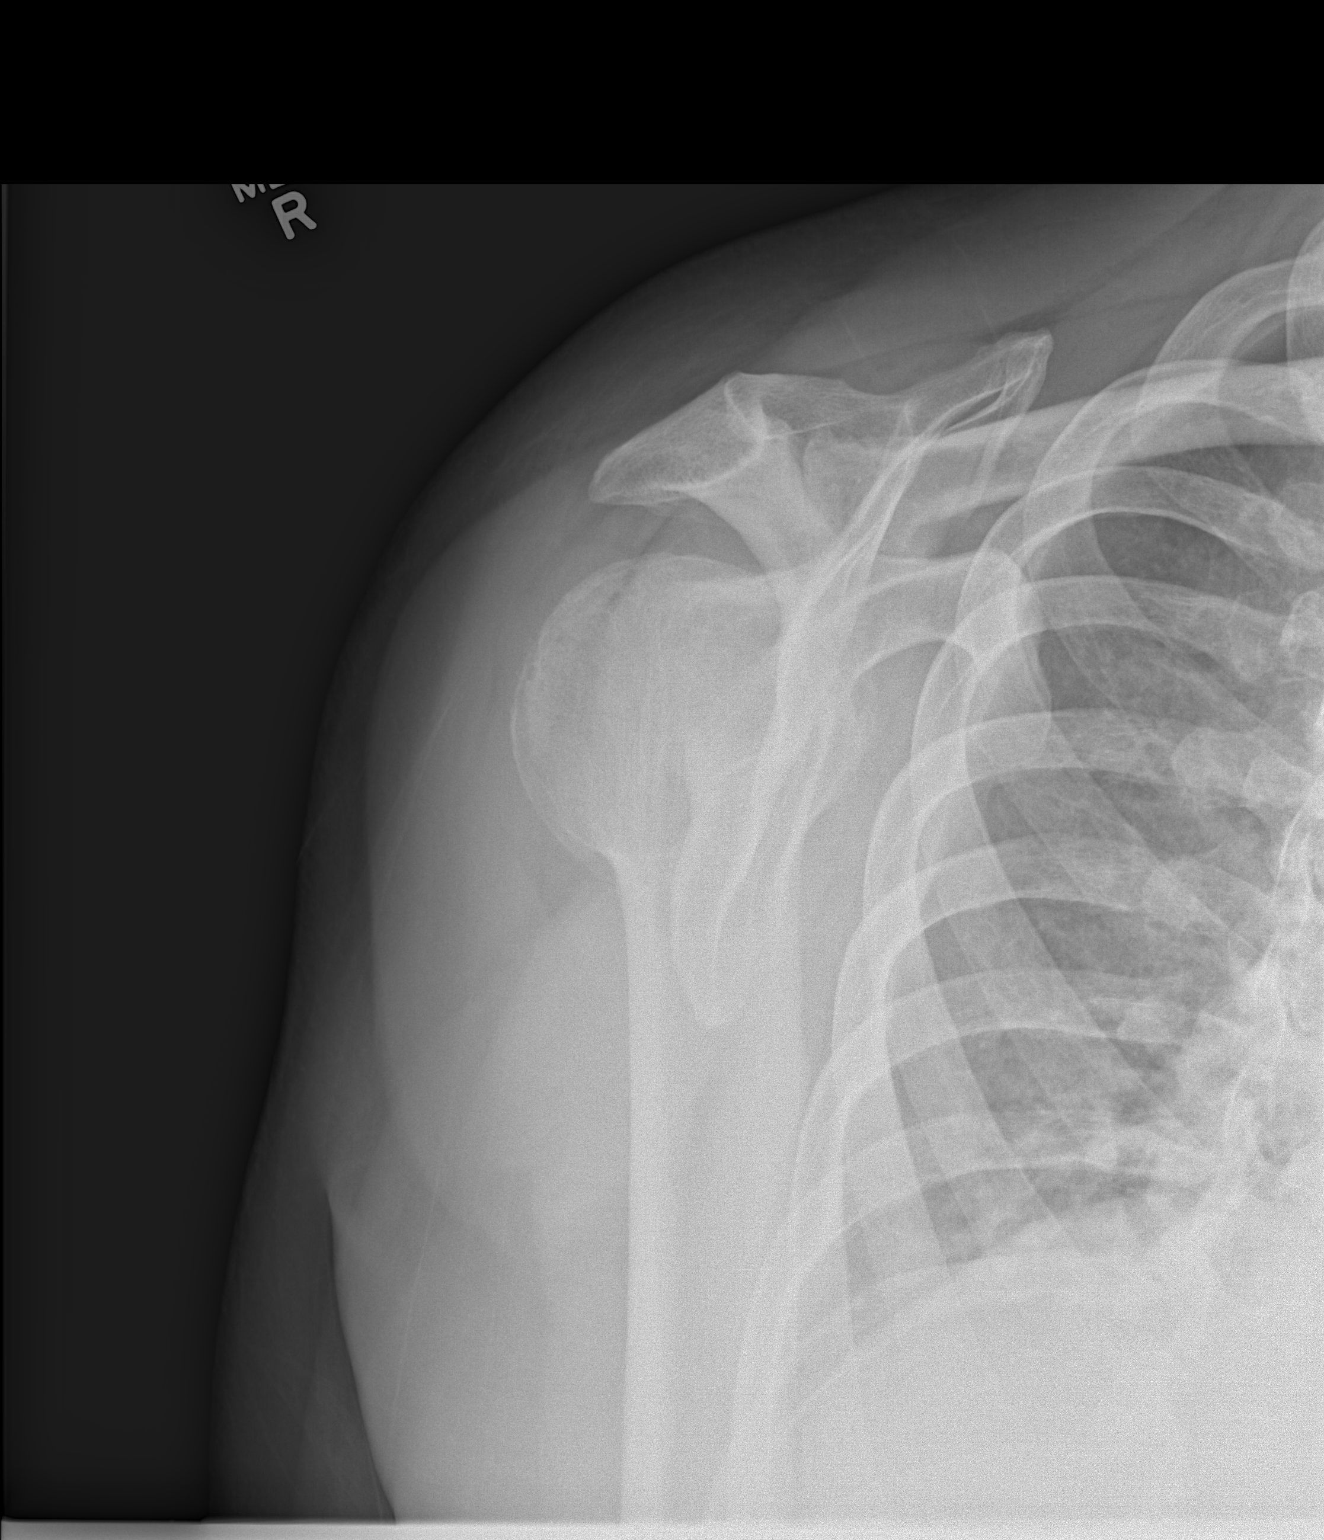

[w shoulder y-view right]
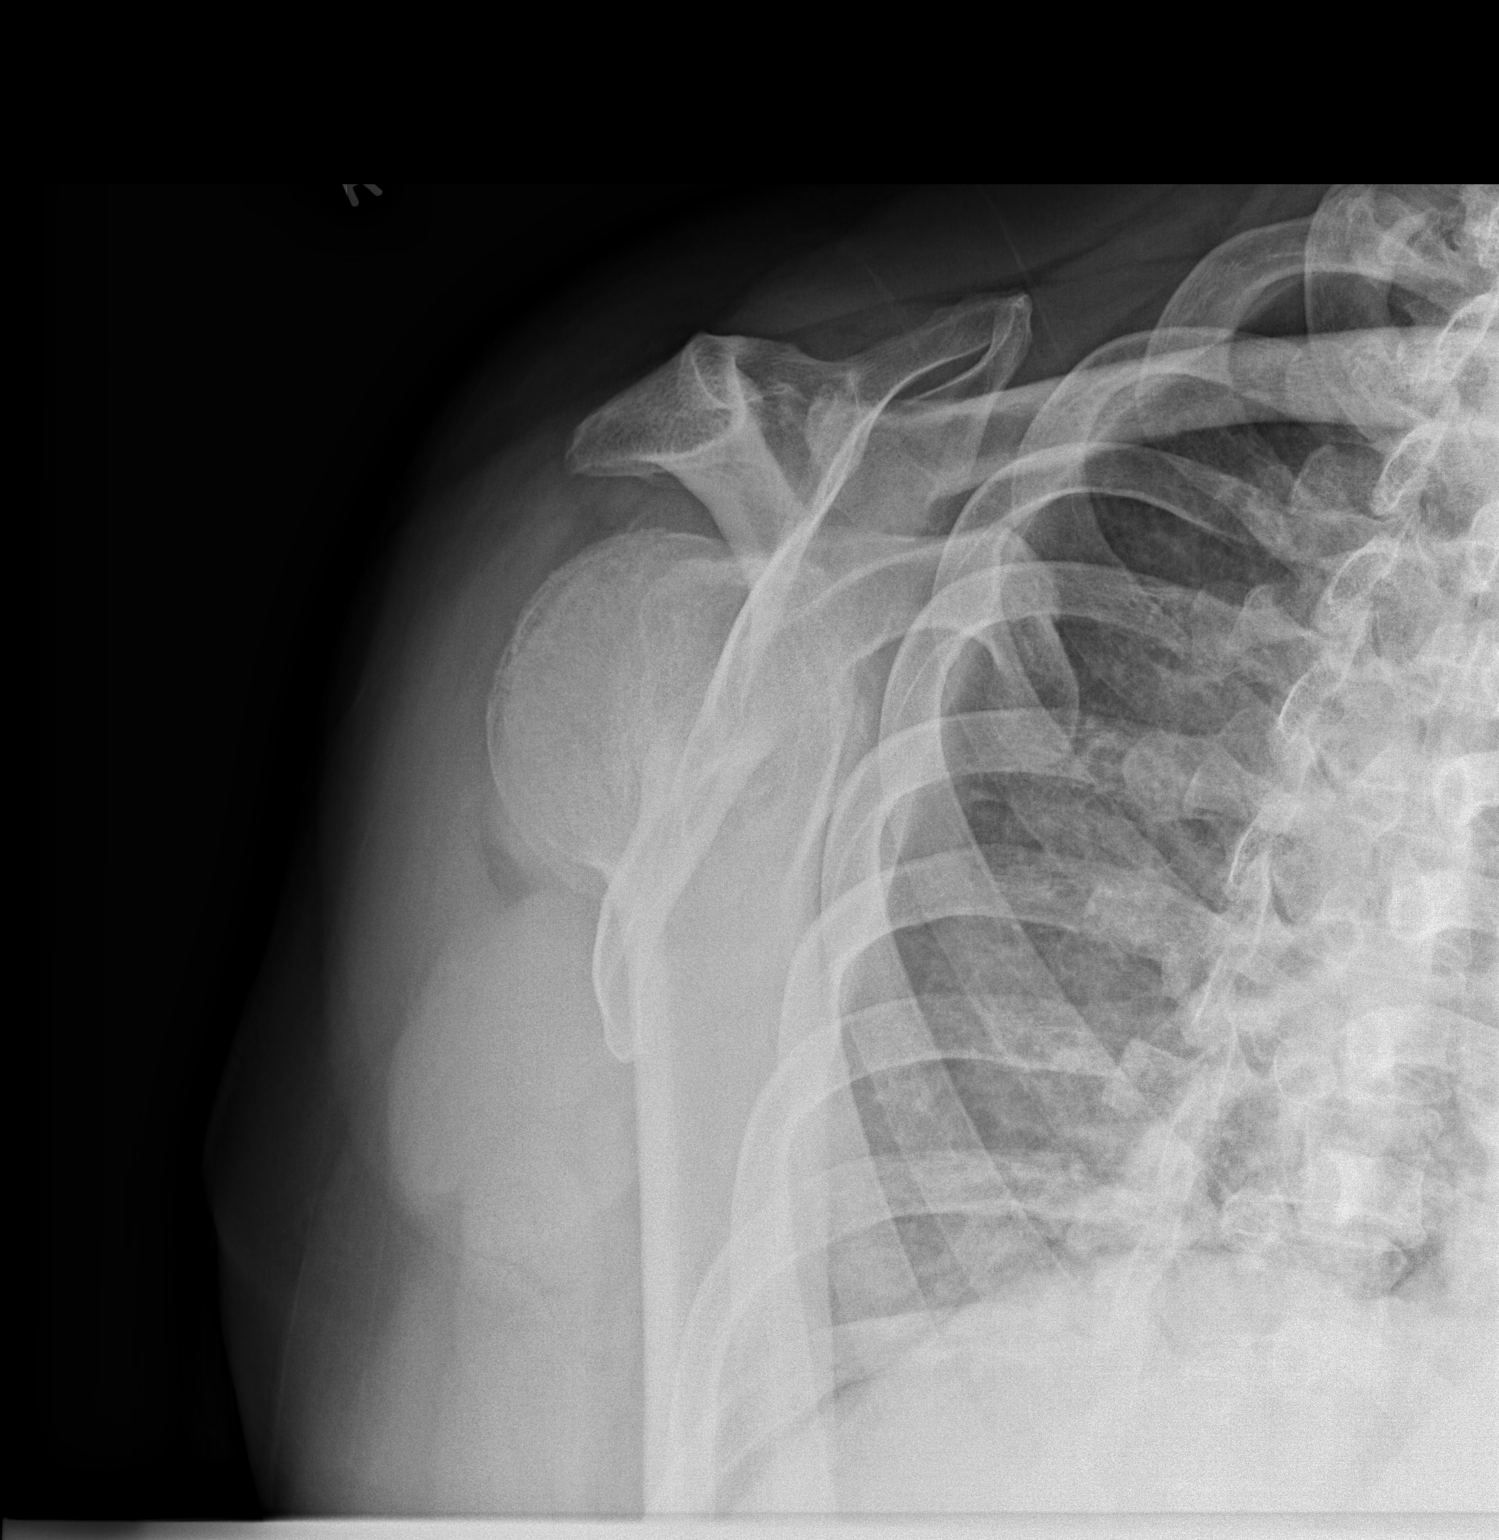

[3 of 3 positions shown; findings below may reference images not displayed]

FINDINGS: Three views of the right shoulder submitted. No acute fracture or
subluxation. There is inferior spurring of distal clavicle. Mild
degenerative changes glenohumeral joint. Spurring of humeral head.
Degenerative changes AC joint.
IMPRESSION: No acute fracture or subluxation. Osteoarthritic changes as
described above.

## 2015-06-01 ENCOUNTER — Encounter (HOSPITAL_COMMUNITY): Payer: Self-pay | Admitting: Nurse Practitioner

## 2015-06-01 ENCOUNTER — Emergency Department (HOSPITAL_COMMUNITY)
Admission: EM | Admit: 2015-06-01 | Discharge: 2015-06-01 | Disposition: A | Discharge status: Home / Self Care | Payer: Self-pay | Attending: Emergency Medicine | Admitting: Emergency Medicine

## 2015-06-01 DIAGNOSIS — K088 Other specified disorders of teeth and supporting structures: Secondary | ICD-10-CM | POA: Insufficient documentation

## 2015-06-01 DIAGNOSIS — Z79899 Other long term (current) drug therapy: Secondary | ICD-10-CM | POA: Insufficient documentation

## 2015-06-01 DIAGNOSIS — K0889 Other specified disorders of teeth and supporting structures: Secondary | ICD-10-CM

## 2015-06-01 DIAGNOSIS — E782 Mixed hyperlipidemia: Secondary | ICD-10-CM | POA: Insufficient documentation

## 2015-06-01 DIAGNOSIS — I10 Essential (primary) hypertension: Secondary | ICD-10-CM | POA: Insufficient documentation

## 2015-06-01 DIAGNOSIS — K047 Periapical abscess without sinus: Secondary | ICD-10-CM

## 2015-06-01 MED ORDER — NAPROXEN 500 MG PO TABS: 500.0000 mg | ORAL_TABLET | Freq: Two times a day (BID) | ORAL | Status: AC

## 2015-06-01 MED ORDER — PENICILLIN V POTASSIUM 250 MG PO TABS: 500.0000 mg | ORAL_TABLET | Freq: Four times a day (QID) | ORAL | Status: AC

## 2015-06-01 MED ORDER — PENICILLIN V POTASSIUM 250 MG PO TABS
500.0000 mg | ORAL_TABLET | Freq: Once | ORAL | Status: AC
  Administered 2015-06-01: 500 mg via ORAL
  Filled 2015-06-01: qty 2

## 2015-06-01 NOTE — ED Provider Notes (Signed)
CSN: [id number]     Arrival date & time 06/01/15  1331 History  This chart was scribed for Eber Hong, MD by Budd Palmer, ED Scribe. This patient was seen in room TR06C/TR06C and the patient's care was started at 1:43 PM.    Chief Complaint  Patient presents with  . Dental Pain   The history is provided by the patient. No language interpreter was used.   HPI Comments: Cole Carey is a 59 y.o. male who presents to the Emergency Department complaining of throbbing, constant dental pain of the right lower jaw onset 4-6 days ago. Pt has a fractured tooth as of about a month ago. He has taken naproxen and ibuprofen with no relief. Pt denies stiffness of the neck, CP, and dyspnea.  He has increased pain with chewing. No f/c  Past Medical History  Diagnosis Date  . Hypertension   . High cholesterol    Past Surgical History  Procedure Laterality Date  . Rotator cuff repair Right   . Hernia repair     History reviewed. No pertinent family history. History  Substance Use Topics  . Smoking status: Never Smoker   . Smokeless tobacco: Not on file  . Alcohol Use: Yes     Comment: occ    Review of Systems  HENT: Positive for dental problem.   Cardiovascular: Negative for chest pain.  Musculoskeletal: Negative for neck stiffness.      Allergies  Review of patient's allergies indicates no known allergies.  Home Medications   Prior to Admission medications   Medication Sig Start Date End Date Taking? Authorizing Provider  atorvastatin (LIPITOR) 20 MG tablet Take 20 mg by mouth daily.    Historical Provider, MD  losartan (COZAAR) 100 MG tablet Take 100 mg by mouth daily.    Historical Provider, MD  naproxen (NAPROSYN) 500 MG tablet Take 1 tablet (500 mg total) by mouth 2 (two) times daily with a meal. 06/01/15   Eber Hong, MD  oxyCODONE-acetaminophen (PERCOCET/ROXICET) 5-325 MG per tablet Take 1-2 tablets by mouth every 6 (six) hours as needed for severe pain. 02/23/15   Teressa Lower, NP  penicillin v potassium (VEETID) 250 MG tablet Take 2 tablets (500 mg total) by mouth 4 (four) times daily. 06/01/15   Eber Hong, MD   BP 126/91 mmHg  Pulse 85  Temp(Src) 98.5 F (36.9 C) (Oral)  Resp 18  Ht  (1.854 m)  Wt 240 lb (108.863 kg)  BMI 31.67 kg/m2  SpO2 97% Physical Exam  Constitutional: He appears well-developed and well-nourished.  HENT:  Head: Normocephalic and atraumatic.  2nd premolar has rotted down to the gumline. Erythema around the dental base. No trismus or torticollis.   Eyes: Conjunctivae are normal. Right eye exhibits no discharge. Left eye exhibits no discharge.  Neck: Normal range of motion. Neck supple.  No stiffness  Pulmonary/Chest: Effort normal. No respiratory distress.  Lymphadenopathy:    He has no cervical adenopathy.  Neurological: He is alert. Coordination normal.  Skin: Skin is warm and dry. No rash noted. He is not diaphoretic. No erythema.  Psychiatric: He has a normal mood and affect.  Nursing note and vitals reviewed.   ED Course  Procedures  DIAGNOSTIC STUDIES: Oxygen Saturation is 97% on RA, adequate by my interpretation.    COORDINATION OF CARE: 1:49 PM - Discussed plans to order antibiotics and pain medication. Advised to continue taking ibuprofen. Will refer to a dentist. Pt advised of plan for treatment  and pt agrees.  Labs Review Labs Reviewed - No data to display  Imaging Review No results found.   EKG Interpretation None      MDM   Final diagnoses:  Pain, dental  Dental abscess   denatl pain, no Ludwigs, normal VS, stable for d/c to f/u with dental.  I personally performed the services described in this documentation, which was scribed in my presence. The recorded information has been reviewed and is accurate.    Meds given in ED:  Medications  penicillin v potassium (VEETID) tablet 500 mg (not administered)    New Prescriptions   NAPROXEN (NAPROSYN) 500 MG TABLET    Take 1 tablet  (500 mg total) by mouth 2 (two) times daily with a meal.   PENICILLIN V POTASSIUM (VEETID) 250 MG TABLET    Take 2 tablets (500 mg total) by mouth 4 (four) times daily.       Eber Hong, MD 06/01/15 1352

## 2015-06-01 NOTE — Discharge Instructions (Signed)

## 2015-06-01 NOTE — ED Notes (Signed)
Pt c/o R lower toothache x 4-6 days, increased with eating, tried OTC pain meds with no relief

## 2015-07-12 ENCOUNTER — Emergency Department (HOSPITAL_COMMUNITY)
Admission: EM | Admit: 2015-07-12 | Discharge: 2015-07-12 | Disposition: A | Discharge status: Home / Self Care | Payer: Self-pay | Attending: Emergency Medicine | Admitting: Emergency Medicine

## 2015-07-12 ENCOUNTER — Encounter (HOSPITAL_COMMUNITY): Payer: Self-pay

## 2015-07-12 DIAGNOSIS — Z791 Long term (current) use of non-steroidal anti-inflammatories (NSAID): Secondary | ICD-10-CM | POA: Insufficient documentation

## 2015-07-12 DIAGNOSIS — Z792 Long term (current) use of antibiotics: Secondary | ICD-10-CM | POA: Insufficient documentation

## 2015-07-12 DIAGNOSIS — Z760 Encounter for issue of repeat prescription: Secondary | ICD-10-CM | POA: Insufficient documentation

## 2015-07-12 DIAGNOSIS — I10 Essential (primary) hypertension: Secondary | ICD-10-CM | POA: Insufficient documentation

## 2015-07-12 DIAGNOSIS — E780 Pure hypercholesterolemia: Secondary | ICD-10-CM | POA: Insufficient documentation

## 2015-07-12 DIAGNOSIS — Z79899 Other long term (current) drug therapy: Secondary | ICD-10-CM | POA: Insufficient documentation

## 2015-07-12 MED ORDER — LISINOPRIL 20 MG PO TABS: 20.0000 mg | ORAL_TABLET | Freq: Every day | ORAL | Status: AC

## 2015-07-12 NOTE — Discharge Instructions (Signed)
Continue to take lisinopril as prescribed. Follow-up with a primary care doctor. Return if any problems.   Medication Refill, Emergency Department We have refilled your medication today as a courtesy to you. It is best for your medical care, however, to take care of getting refills done through your primary caregiver's office. They have your records and can do a better job of follow-up than we can in the emergency department. On maintenance medications, we often only prescribe enough medications to get you by until you are able to see your regular caregiver. This is a more expensive way to refill medications. In the future, please plan for refills so that you will not have to use the emergency department for this. Thank you for your help. Your help allows [place] to better take care of the daily emergencies that enter our department. Document Released: 02/03/2004 Document Revised: 01/09/2012 Document Reviewed: 01/24/2014 [facility] Patient Information 2015 [place], [place]. This information is not intended to replace advice given to you by your health care provider. Make sure you discuss any questions you have with your health care provider.

## 2015-07-12 NOTE — ED Notes (Signed)
Pt presents needing a HTN medication refill.  Pt reports that he ran out x 3 days ago.  When asked about a PCP, Pt sts "I have an orange card I don't go back for the reevaulation until mid-October."

## 2015-07-12 NOTE — ED Provider Notes (Signed)
CSN: [id number]     Arrival date & time 07/12/15  1151 History  This chart was scribed for non-physician practitioner Jaynie Crumble, PA-C working with Lavera Guise, MD by Lyndel Safe, ED Scribe. This patient was seen in room WTR7/WTR7 and the patient's care was started at 1:02 PM.   Chief Complaint  Patient presents with  . Medication Refill    The history is provided by the patient. No language interpreter was used.   HPI Comments: Cole Carey is a 59 y.o. male, with a PMhx of HTN and HLD, who presents to the Emergency Department for an HTN medication refill. Pt reports he has been out of Lisinopril  for 3 days. He states he took a BP medication today that was expired but he is unsure of the medication name. Pt reports he has an orange card which is expired and that he has reevaluation on October 17th. He previously received prescriptions through the [facility]. He denies experiencing any symptoms today but reports a headache 1 day ago with a BP reading at180/118mmHg yesterday. Current BP is 127/89 mmHg.   Past Medical History  Diagnosis Date  . Hypertension   . High cholesterol    Past Surgical History  Procedure Laterality Date  . Rotator cuff repair Right   . Hernia repair     History reviewed. No pertinent family history. Social History  Substance Use Topics  . Smoking status: Never Smoker   . Smokeless tobacco: None  . Alcohol Use: Yes     Comment: occ    Review of Systems  Cardiovascular: Negative for chest pain and leg swelling.  Neurological: Negative for dizziness, light-headedness and headaches.   Allergies  Review of patient's allergies indicates no known allergies.  Home Medications   Prior to Admission medications   Medication Sig Start Date End Date Taking? Authorizing Provider  atorvastatin (LIPITOR) 20 MG tablet Take 20 mg by mouth daily.    Historical Provider, MD  losartan (COZAAR) 100 MG tablet Take 100 mg by  mouth daily.    Historical Provider, MD  naproxen (NAPROSYN) 500 MG tablet Take 1 tablet (500 mg total) by mouth 2 (two) times daily with a meal. 06/01/15   Eber Hong, MD  oxyCODONE-acetaminophen (PERCOCET/ROXICET) 5-325 MG per tablet Take 1-2 tablets by mouth every 6 (six) hours as needed for severe pain. 02/23/15   Teressa Lower, NP  penicillin v potassium (VEETID) 250 MG tablet Take 2 tablets (500 mg total) by mouth 4 (four) times daily. 06/01/15   Eber Hong, MD   BP 127/89 mmHg  Pulse 74  Temp(Src) 97.8 F (36.6 C) (Oral)  Resp 20  SpO2 98% Physical Exam  Constitutional: He appears well-developed and well-nourished. No distress.  HENT:  Head: Normocephalic.  Eyes: Conjunctivae are normal.  Neck: Neck supple. No JVD present.  Cardiovascular: Normal rate, regular rhythm and normal heart sounds.   Pulmonary/Chest: Effort normal and breath sounds normal. No respiratory distress. He has no wheezes. He has no rales.  Musculoskeletal: He exhibits no edema.  Neurological: He is alert. Coordination normal.  Skin: Skin is warm. No rash noted. No erythema. No pallor.  Psychiatric: He has a normal mood and affect. His behavior is normal.  Nursing note and vitals reviewed.   ED Course  Procedures  DIAGNOSTIC STUDIES: Oxygen Saturation is 98% on RA, normal by my [id number]   COORDINATION OF CARE: 1:05 PM Discussed treatment plan with pt. Pt acknowledges and agrees to  plan.   Labs Review Labs Reviewed - No data to display  Imaging Review No results found. I have personally reviewed and evaluated these images and lab results as part of my medical decision-making.   EKG Interpretation None      MDM   Final diagnoses:  Medication refill   Patient emergency department for lisinopril refill. He is currently waiting to get into a community clinic and to get his orange card renewed. Patient's vital signs here are normal, blood pressures 11/27/1988. He is asymptomatic. I  will give her a refill for 2 months. Instructed to follow-up.  Filed Vitals:   07/12/15 1203  BP: 127/89  Pulse: 74  Temp: 97.8 F (36.6 C)  TempSrc: Oral  Resp: 20  SpO2: 98%    I personally performed the services described in this documentation, which was scribed in my presence. The recorded information has been reviewed and is accurate.   Jaynie Crumble, PA-C 07/12/15 1309  Lavera Guise, MD 07/12/15 1942

## 2015-10-24 ENCOUNTER — Emergency Department: Admit: 2015-10-24 | Payer: Self-pay

## 2015-10-24 ENCOUNTER — Inpatient Hospital Stay
Admit: 2015-10-24 | Discharge: 2015-10-24 | Disposition: A | Discharge status: Home / Self Care | Payer: Self-pay | Attending: Emergency Medicine

## 2015-10-24 DIAGNOSIS — S46911A Strain of unspecified muscle, fascia and tendon at shoulder and upper arm level, right arm, initial encounter: Secondary | ICD-10-CM

## 2015-10-24 MED ORDER — HYDROCODONE-ACETAMINOPHEN 5 MG-325 MG TAB
5-325 mg | ORAL | Status: AC
Start: 2015-10-24 — End: 2015-10-24
  Administered 2015-10-24: 09:00:00 via ORAL

## 2015-10-24 MED ORDER — PROMETHAZINE 25 MG TAB
25 mg | ORAL | Status: AC
Start: 2015-10-24 — End: 2015-10-24
  Administered 2015-10-24: 09:00:00 via ORAL

## 2015-10-24 MED ORDER — HYDROCODONE-ACETAMINOPHEN 5 MG-325 MG TAB
5-325 mg | ORAL_TABLET | ORAL | 0 refills | Status: AC | PRN
Start: 2015-10-24 — End: ?

## 2015-10-24 MED ORDER — PROMETHAZINE 25 MG TAB
25 mg | ORAL_TABLET | Freq: Four times a day (QID) | ORAL | 0 refills | Status: AC | PRN
Start: 2015-10-24 — End: ?

## 2015-10-24 MED FILL — HYDROCODONE-ACETAMINOPHEN 5 MG-325 MG TAB: 5-325 mg | ORAL | Qty: 1

## 2015-10-24 MED FILL — PROMETHAZINE 25 MG TAB: 25 mg | ORAL | Qty: 1

## 2015-10-24 NOTE — ED Provider Notes (Signed)
HPI Comments: Patient states he was coming down some steps when he missed 1.  Larey SeatFell and put his arms out in front of him to catch his fall.  He had immediate right shoulder pain.  He describes it as sharp posterior nonradiating pain, worse with movement of his right arm.  He denies similar pain in the past and has not taken any medicine for his symptoms.  He says the pain is severe at times.    Patient is a [age] y.o. male presenting with fall. The history is provided by the patient.   Fall   Pertinent negatives include no fever, no nausea and no vomiting.        History reviewed. No pertinent past medical history.    History reviewed. No pertinent past surgical history.      History reviewed. No pertinent family history.    Social History     Social History   ??? Marital status: DIVORCED     Spouse name: N/A   ??? Number of children: N/A   ??? Years of education: N/A     Occupational History   ??? Not on file.     Social History Main Topics   ??? Smoking status: Never Smoker   ??? Smokeless tobacco: Not on file   ??? Alcohol use No   ??? Drug use: No   ??? Sexual activity: Not on file     Other Topics Concern   ??? Not on file     Social History Narrative   ??? No narrative on file         ALLERGIES: Review of patient's allergies indicates no known allergies.    Review of Systems   Constitutional: Negative for chills and fever.   Gastrointestinal: Negative for nausea and vomiting.   All other systems reviewed and are negative.      Vitals:    10/24/15 0224   BP: (!) 159/99   Pulse: 81   Resp: 20   Temp: 98.2 ??F (36.8 ??C)   SpO2: 94%   Weight: 100.7 kg (222 lb)   Height: 6\' 1"  (1.854 m)            Physical Exam   Constitutional: He is oriented to person, place, and time. He appears well-developed and well-nourished.   HENT:   Head: Normocephalic and atraumatic.   Eyes: Conjunctivae are normal. Pupils are equal, round, and reactive to light.   Neck: Normal range of motion. Neck supple.    Musculoskeletal: He exhibits tenderness. He exhibits no edema.   Tenderness to palpation right shoulder posteriorly.  No before meals tenderness to palpation and no step-off.  He is neurovascularly intact distally.   Decreased range of motion secondary to pain.   Neurological: He is alert and oriented to person, place, and time.   Skin: Skin is warm and dry.   Psychiatric: He has a normal mood and affect. His behavior is normal.   Nursing note and vitals reviewed.       MDM  Number of Diagnoses or Management Options  Shoulder strain, right, initial encounter:   Diagnosis management comments: Differential diagnosis: Shoulder strain/fracture/dislocation  4:00 AM .  Discussed results with patient, normal x-rays.  Clinically, he has what appears to be a rotator cuff injury, he will be referred to orthopedics and given a sling       Amount and/or Complexity of Data Reviewed  Tests in the radiology section of CPT??: ordered and reviewed  Independent visualization of  images, tracings, or specimens: yes    Risk of Complications, Morbidity, and/or Mortality  Presenting problems: moderate  Diagnostic procedures: low  Management options: moderate    Patient Progress  Patient progress: improved    ED Course       Procedures

## 2015-10-24 NOTE — ED Notes (Signed)
I have reviewed discharge instructions with the patient.  The patient verbalized understanding. Prescriptions x 2 given to pt. Patient denies any further needs, questions, or concerns at this time. No adverse reactions to any treatments, meds, or procedures noted. Pt ambulatory with steady gait to waiting room to await ride.Pt signed hard copy d/c instructions form.

## 2015-10-24 NOTE — ED Notes (Signed)
Sling applied per MD order.

## 2015-10-24 NOTE — ED Triage Notes (Signed)
Fell down about 3 steps at mid-night.  States "I just missed a step".  Pain right shoulder.
